# Patient Record
Sex: Female | Born: 1980 | Race: White | Hispanic: No | Marital: Single | Smoking: Current every day smoker
Health system: Southern US, Community
[De-identification: ages and names within clinical notes are randomized; demographics above are authoritative.]

## PROBLEM LIST (undated history)

## (undated) DIAGNOSIS — N200 Calculus of kidney: Secondary | ICD-10-CM

## (undated) HISTORY — PX: ABDOMINAL SURGERY: SHX537

## (undated) HISTORY — PX: TONSILLECTOMY: SUR1361

---

## 2015-06-08 ENCOUNTER — Emergency Department (HOSPITAL_COMMUNITY): Payer: Self-pay

## 2015-06-08 ENCOUNTER — Emergency Department (HOSPITAL_COMMUNITY)
Admission: EM | Admit: 2015-06-08 | Discharge: 2015-06-08 | Disposition: A | Discharge status: Home / Self Care | Payer: Self-pay | Attending: Emergency Medicine | Admitting: Emergency Medicine

## 2015-06-08 ENCOUNTER — Encounter (HOSPITAL_COMMUNITY): Payer: Self-pay | Admitting: Emergency Medicine

## 2015-06-08 DIAGNOSIS — S90519A Abrasion, unspecified ankle, initial encounter: Secondary | ICD-10-CM

## 2015-06-08 DIAGNOSIS — S90512A Abrasion, left ankle, initial encounter: Secondary | ICD-10-CM | POA: Insufficient documentation

## 2015-06-08 DIAGNOSIS — Y998 Other external cause status: Secondary | ICD-10-CM | POA: Insufficient documentation

## 2015-06-08 DIAGNOSIS — Y9389 Activity, other specified: Secondary | ICD-10-CM | POA: Insufficient documentation

## 2015-06-08 DIAGNOSIS — Y9289 Other specified places as the place of occurrence of the external cause: Secondary | ICD-10-CM | POA: Insufficient documentation

## 2015-06-08 DIAGNOSIS — S60811A Abrasion of right wrist, initial encounter: Secondary | ICD-10-CM | POA: Insufficient documentation

## 2015-06-08 DIAGNOSIS — S60819A Abrasion of unspecified wrist, initial encounter: Secondary | ICD-10-CM

## 2015-06-08 DIAGNOSIS — S90511A Abrasion, right ankle, initial encounter: Secondary | ICD-10-CM | POA: Insufficient documentation

## 2015-06-08 DIAGNOSIS — F17200 Nicotine dependence, unspecified, uncomplicated: Secondary | ICD-10-CM | POA: Insufficient documentation

## 2015-06-08 DIAGNOSIS — S60812A Abrasion of left wrist, initial encounter: Secondary | ICD-10-CM | POA: Insufficient documentation

## 2015-06-08 NOTE — ED Notes (Signed)
Patient c/o bilateral ankle and wrist pain. Patient is in custody of police. Patient was involved in an altercation with the police, was attempting to free herself from their handcuffs. When asked what happened patient states "its obvious".

## 2015-06-08 NOTE — ED Notes (Signed)
MD at bedside. 

## 2015-06-08 NOTE — ED Provider Notes (Signed)
CSN: [id number]     Arrival date & time 06/08/15  0542 History   First MD Initiated Contact with Patient 06/08/15 [phone]     Chief Complaint  Patient presents with  . Ankle Pain    bilateral  . Wrist Pain    bilateral     (Consider location/radiation/quality/duration/timing/severity/associated sxs/prior Treatment) HPI  This is a 34 year old female who was brought in in police custody with wrist and ankle pain. Patient is minimally contributory for history taking.  She states "they did this to me." She reports pain in her bilateral wrists and ankles. She has been handcuffed. Per police, patient was being arrested and became combative. She required maximal restraints. She was handcuffed and shackled. She pulled on the handcuffs and shackles. They were unable to take her to jail because the nurse at jail was concerned given the abrasions to her wrist and ankles.  History reviewed. No pertinent past medical history. History reviewed. No pertinent past surgical history. History reviewed. No pertinent family history. Social History  Substance Use Topics  . Smoking status: Current Every Day Smoker  . Smokeless tobacco: None  . Alcohol Use: No   OB History    No data available     Review of Systems  Musculoskeletal:       Wrist and ankle pain  Skin: Positive for wound.  All other systems reviewed and are negative.     Allergies  Sulfa antibiotics  Home Medications   Prior to Admission medications   Not on File   BP 122/78 mmHg  Pulse 55  Temp(Src) 98.7 F (37.1 C) (Oral)  Resp 18  SpO2 98%  LMP 06/07/2015 (Exact Date) Physical Exam  Constitutional: She is oriented to person, place, and time. No distress.  Disheveled, no acute distress, ABC's intact  HENT:  Head: Normocephalic and atraumatic.  Cardiovascular: Normal rate, regular rhythm and normal heart sounds.   No murmur heard. Pulmonary/Chest: Effort normal and breath sounds normal. No respiratory distress. She  has no wheezes.  Musculoskeletal:  Superficial abrasions and erythema to the bilateral wrists corresponding to handcuffs marks, she also has superficial abrasions over her knuckles, no obvious deformities, full range of motion, full strength, she does have tenderness to palpation over the left snuffbox Superficial abrasions noted over the bilateral ankles again consistent with cuff marks, normal range of motion, 2+ DP pulse, patient has been ambulatory.  Neurological: She is alert and oriented to person, place, and time.  Skin: Skin is warm and dry.  Psychiatric: She has a normal mood and affect.  Nursing note and vitals reviewed.   ED Course  Procedures (including critical care time) Labs Review Labs Reviewed - No data to display  Imaging Review No results found. I have personally reviewed and evaluated these images and lab results as part of my medical decision-making.   EKG Interpretation None      MDM   Final diagnoses:  Ankle abrasion, unspecified laterality, initial encounter  Abrasion of wrist, unspecified laterality, initial encounter    Patient presents with bilateral wrist and ankle pain. She has been in police custody and handcuffed and in shackles. Per police report, patient has been combative and tried to get out of shackles. She has superficial abrasions and excoriations. She does have about box tenderness on the left. Plain films obtained and negative. She was placed in a on the left and instructed to follow-up in one week to evaluate for occult scaphoid fracture. Patient was released into police custody.  After history, exam, and medical workup I feel the patient has been appropriately medically screened and is safe for discharge home. Pertinent diagnoses were discussed with the patient. Patient was given return precautions.   Shon Batonourtney F Saqib Cazarez, MD 06/10/15 [phone]

## 2015-06-08 NOTE — Discharge Instructions (Signed)
You were seen today for wrist and ankle pain. You have abrasion secondary to handcuffs. Even the tenderness in your left wrist, you will be placed in a wrist splint. He may have a fracture that does not show up on x-ray. You need to have repeat x-rays in one week.  Abrasion An abrasion is a cut or scrape on the outer surface of your skin. An abrasion does not extend through all of the layers of your skin. It is important to care for your abrasion properly to prevent infection. CAUSES Most abrasions are caused by falling on or gliding across the ground or another surface. When your skin rubs on something, the outer and inner layer of skin rubs off.  SYMPTOMS A cut or scrape is the main symptom of this condition. The scrape may be bleeding, or it may appear red or pink. If there was an associated fall, there may be an underlying bruise. DIAGNOSIS An abrasion is diagnosed with a physical exam. TREATMENT Treatment for this condition depends on how large and deep the abrasion is. Usually, your abrasion will be cleaned with water and mild soap. This removes any dirt or debris that may be stuck. An antibiotic ointment may be applied to the abrasion to help prevent infection. A bandage (dressing) may be placed on the abrasion to keep it clean. You may also need a tetanus shot. HOME CARE INSTRUCTIONS Medicines  Take or apply medicines only as directed by your health care provider.  If you were prescribed an antibiotic ointment, finish all of it even if you start to feel better. Wound Care  Clean the wound with mild soap and water 2-3 times per day or as directed by your health care provider. Pat your wound dry with a clean towel. Do not rub it.  There are many different ways to close and cover a wound. Follow instructions from your health care provider about:  Wound care.  Dressing changes and removal.  Check your wound every day for signs of infection. Watch for:  Redness, swelling, or  pain.  Fluid, blood, or pus. General Instructions  Keep the dressing dry as directed by your health care provider. Do not take baths, swim, use a hot tub, or do anything that would put your wound underwater until your health care provider approves.  If there is swelling, raise (elevate) the injured area above the level of your heart while you are sitting or lying down.  Keep all follow-up visits as directed by your health care provider. This is important. SEEK MEDICAL CARE IF:  You received a tetanus shot and you have swelling, severe pain, redness, or bleeding at the injection site.  Your pain is not controlled with medicine.  You have increased redness, swelling, or pain at the site of your wound. SEEK IMMEDIATE MEDICAL CARE IF:  You have a red streak going away from your wound.  You have a fever.  You have fluid, blood, or pus coming from your wound.  You notice a bad smell coming from your wound or your dressing.   This information is not intended to replace advice given to you by your health care provider. Make sure you discuss any questions you have with your health care provider.   Document Released: 04/02/2005 Document Revised: 03/14/2015 Document Reviewed: 06/21/2014 Elsevier Interactive Patient Education [employer]2016 [employer]

## 2015-06-08 NOTE — ED Notes (Signed)
Patient ambulatory into triage, in handcuffs. Police accompanying patient, sitting at bedside at this time. Patient with full ROM x4. Patient with redness and abrasions x4. Patient c/o pain to right wrist when hand is touched.

## 2016-05-14 ENCOUNTER — Emergency Department (HOSPITAL_COMMUNITY): Payer: Self-pay

## 2016-05-14 ENCOUNTER — Encounter (HOSPITAL_COMMUNITY): Payer: Self-pay | Admitting: Emergency Medicine

## 2016-05-14 ENCOUNTER — Emergency Department (HOSPITAL_COMMUNITY)
Admission: EM | Admit: 2016-05-14 | Discharge: 2016-05-15 | Disposition: A | Discharge status: Home / Self Care | Payer: Self-pay | Attending: Emergency Medicine | Admitting: Emergency Medicine

## 2016-05-14 DIAGNOSIS — F17210 Nicotine dependence, cigarettes, uncomplicated: Secondary | ICD-10-CM | POA: Insufficient documentation

## 2016-05-14 DIAGNOSIS — Y999 Unspecified external cause status: Secondary | ICD-10-CM | POA: Insufficient documentation

## 2016-05-14 DIAGNOSIS — R52 Pain, unspecified: Secondary | ICD-10-CM

## 2016-05-14 DIAGNOSIS — Y929 Unspecified place or not applicable: Secondary | ICD-10-CM | POA: Insufficient documentation

## 2016-05-14 DIAGNOSIS — Y9389 Activity, other specified: Secondary | ICD-10-CM | POA: Insufficient documentation

## 2016-05-14 DIAGNOSIS — W010XXA Fall on same level from slipping, tripping and stumbling without subsequent striking against object, initial encounter: Secondary | ICD-10-CM | POA: Insufficient documentation

## 2016-05-14 DIAGNOSIS — S53105A Unspecified dislocation of left ulnohumeral joint, initial encounter: Secondary | ICD-10-CM

## 2016-05-14 DIAGNOSIS — S53025A Posterior dislocation of left radial head, initial encounter: Secondary | ICD-10-CM | POA: Insufficient documentation

## 2016-05-14 HISTORY — DX: Calculus of kidney: N20.0

## 2016-05-14 MED ORDER — HYDROMORPHONE HCL 1 MG/ML IJ SOLN
1.0000 mg | Freq: Once | INTRAMUSCULAR | Status: AC
  Administered 2016-05-14: 1 mg via INTRAVENOUS
  Filled 2016-05-14: qty 1

## 2016-05-14 NOTE — ED Notes (Signed)
Bed: [id number] Expected date:  Expected time:  Means of arrival:  Comments: EMS elbow injury

## 2016-05-14 NOTE — ED Provider Notes (Signed)
[facility]-EMERGENCY DEPT Provider Note   CSN: [id number] Arrival date & time: 05/14/16  2235   By signing my name below, I, Emmanuella Mensah, attest that this documentation has been prepared under the direction and in the presence of Zadie Rhine, MD. Electronically Signed: Angelene Giovanni, ED Scribe. 05/14/16. 11:30 PM.   History   Chief Complaint Chief Complaint  Patient presents with  . Elbow Injury    HPI Comments: Sara Vazquez is a 35 y.o. female brought in by ambulance, who presents to the Emergency Department complaining of gradually worsening moderate left elbow pain s/p fall that occurred PTA. She notes that the pain is worse with movement. She reports associated mild left shoulder pain and an abrasion on her left knee. She adds that the abrasion was from a prior incident but the fall caused it to bleed. She explains that she was jogging on slippery rocks when she slipped and left onto her left side unto a rock. She denies any head injuries or LOC. No other injuries noted. Pt did not try any medications prior to EMS arrival. She received 100 mcg fentanyl by EMS en route. She reports an allergy to sulfa antibiotics. Her last meal was at 6:30 pm tonight. She denies any fever, headaches, numbness, tingling in LUE, weakness, nausea, vomiting, abdominal pain, chest pain, or any other symptoms.   The history is provided by the patient. No language interpreter was used.  Hand Injury   The incident occurred 1 to 2 hours ago. The incident occurred in the street. The injury mechanism was a fall. The pain is present in the left elbow and left shoulder. The pain is moderate. The pain has been constant since the incident. Pertinent negatives include no fever. She reports no foreign bodies present. The symptoms are aggravated by movement. Treatments tried: fentanyl en route. The treatment provided mild relief.    Past Medical History:  Diagnosis Date  . Kidney stones     There are no active  problems to display for this patient.   No past surgical history on file.  OB History    Gravida Para Term Preterm AB Living   5 5 4 1   4    SAB TAB Ectopic Multiple Live Births                   Home Medications    Prior to Admission medications   Not on File    Family History History reviewed. No pertinent family history.  Social History Social History  Substance Use Topics  . Smoking status: Current Every Day Smoker    Types: Cigarettes  . Smokeless tobacco: Never Used  . Alcohol use Yes     Allergies   Shellfish allergy and Sulfa antibiotics   Review of Systems Review of Systems  Constitutional: Negative for fever.  Cardiovascular: Negative for chest pain.  Gastrointestinal: Negative for abdominal pain, nausea and vomiting.  Musculoskeletal: Positive for arthralgias.  Skin: Positive for wound (abrasion).  Neurological: Negative for weakness, numbness and headaches.  All other systems reviewed and are negative.    Physical Exam Updated Vital Signs BP 138/82 (BP Location: Right Arm)   Pulse (!) 122   Temp 98.6 F (37 C) (Oral)   Resp 24   Ht 5\' 7"  (1.702 m)   Wt 125 lb (56.7 kg)   LMP 04/26/2016   SpO2 100%   BMI 19.58 kg/m   Physical Exam  Nursing note and vitals reviewed.  CONSTITUTIONAL: Well developed/well nourished  HEAD: Normocephalic/atraumatic EYES: EOMI/PERRL ENMT: Mucous membranes moist NECK: supple no meningeal signs SPINE/BACK:entire spine nontender CV: S1/S2 noted, no murmurs/rubs/gallops noted LUNGS: Lungs are clear to auscultation bilaterally, no apparent distress ABDOMEN: soft, nontender, no rebound or guarding, bowel sounds noted throughout abdomen NEURO: Pt is awake/alert/appropriate, moves all extremitiesx4.  No facial droop.   EXTREMITIES: pulses normal/equal, full ROM; tenderness and deformity to left elbow; no lacerations noted; distal pulses intact; distal motor intact to left hand, All other extremities/joints  palpated/ranged and nontender SKIN: warm, color normal PSYCH: no abnormalities of mood noted, alert and oriented to situation   ED Treatments / Results  DIAGNOSTIC STUDIES: Oxygen Saturation is 100% on RA, normal by my interpretation.    COORDINATION OF CARE: 11:17 PM- Pt advised of plan for treatment and pt agrees. Pt will receive left elbow x-ray for further evaluation. Consent form given for sedation for possible reduction of left elbow.    Labs (all labs ordered are listed, but only abnormal results are displayed) Labs Reviewed - No data to display  EKG  EKG Interpretation None       Radiology No results found.  Procedures Reduction of dislocation Date/Time: 05/15/2016 12:50 AM Performed by: Zadie RhineWICKLINE, Stylianos Stradling Authorized by: Zadie RhineWICKLINE, Maize Brittingham  Consent: Written consent obtained. Consent given by: patient Patient understanding: patient states understanding of the procedure being performed Patient consent: the patient's understanding of the procedure matches consent given Procedure consent: procedure consent matches procedure scheduled Relevant documents: relevant documents present and verified Test results: test results available and properly labeled Imaging studies: imaging studies available Patient identity confirmed: verbally with patient, arm band and provided demographic data Time out: Immediately prior to procedure a "time out" was called to verify the correct patient, procedure, equipment, support staff and site/side marked as required.  Sedation: Patient sedated: yes (see separate note) Vitals: Vital signs were monitored during sedation. Patient tolerance: Patient tolerated the procedure well with no immediate complications Comments: Performed closed reduction of left elbow posterior dislocation Patient tolerated procedure without any immediate complications     Procedural sedation Performed by: Joya GaskinsWICKLINE,Peder Allums W Consent: Verbal consent obtained. Risks and  benefits: risks, benefits and alternatives were discussed Required items: required  devices, and special equipment available Patient identity confirmed: arm band and provided demographic data Time out: Immediately prior to procedure a "time out" was called to verify the correct patient, procedure, equipment, support staff and site/side marked as required. Sedation type: moderate (conscious) sedation NPO time confirmed and considered Sedatives: PROPOFOL Physician Time at Bedside: 16 Vitals: Vital signs were monitored during sedation. Cardiac Monitor, pulse oximeter Patient tolerance: Patient tolerated the procedure well with no immediate complications. Comments: Pt with uneventful recovery. Returned to pre-procedural sedation baseline   SPLINT APPLICATION Date/Time: 1:09 AM Authorized by: Joya GaskinsWICKLINE,Ayeden Gladman W Consent: Verbal consent obtained. Risks and benefits: risks, benefits and alternatives were discussed Consent given by: patient Splint applied by: orthopedic technician Location details: left elbow Splint type: posterior splint Supplies used: ortho glass Post-procedure: The splinted body part was neurovascularly unchanged following the procedure. Patient tolerance: Patient tolerated the procedure well with no immediate complications.     Medications Ordered in ED Medications  HYDROmorphone (DILAUDID) injection 1 mg (1 mg Intravenous Given 05/14/16 2334)  propofol (DIPRIVAN) 10 mg/mL bolus/IV push 28.4 mg (40 mg Intravenous Given 05/15/16 0049)     Initial Impression / Assessment and Plan / ED Course  Zadie Rhineonald Wayman Hoard, MD has reviewed the triage vital signs and the nursing notes.  Pertinent imaging results that  were available during my care of the patient were reviewed by me and considered in my medical decision making (see chart for details).  Clinical Course     Pt with isolated left elbow dislocation Pt tolerated reduction well After reduction, deformity resolved and she  was able to flex/extend elbow This was successfully reduced Referred to ortho She is in posterior splint and also sling (advised to range shoulder frequently while in sling  Final Clinical Impressions(s) / ED Diagnoses   Final diagnoses:  Pain  Dislocation of left elbow, initial encounter    New Prescriptions New Prescriptions   HYDROCODONE-ACETAMINOPHEN (NORCO/VICODIN) 5-325 MG TABLET    Take 1 tablet by mouth every 6 (six) hours as needed for severe pain.   IBUPROFEN (ADVIL,MOTRIN) 600 MG TABLET    Take 1 tablet (600 mg total) by mouth every 8 (eight) hours as needed.   I personally performed the services described in this documentation, which was scribed in my presence. The recorded information has been reviewed and is accurate.        Zadie Rhineonald Cung Masterson, MD 05/15/16 [phone]

## 2016-05-14 NOTE — ED Triage Notes (Addendum)
Pt was running across a courtyard and tripped, causing her to fall on her left elbow; ETOH on board; 100 mcg fentanyl given en route

## 2016-05-15 ENCOUNTER — Emergency Department (HOSPITAL_COMMUNITY): Payer: Self-pay

## 2016-05-15 MED ORDER — HYDROCODONE-ACETAMINOPHEN 5-325 MG PO TABS: 1.0000 | ORAL_TABLET | Freq: Four times a day (QID) | ORAL | 0 refills | Status: DC | PRN

## 2016-05-15 MED ORDER — IBUPROFEN 800 MG PO TABS
800.0000 mg | ORAL_TABLET | Freq: Once | ORAL | Status: AC
  Administered 2016-05-15: 800 mg via ORAL
  Filled 2016-05-15: qty 1

## 2016-05-15 MED ORDER — SODIUM CHLORIDE 0.9 % IV SOLN
Freq: Once | INTRAVENOUS | Status: AC
  Administered 2016-05-14: 23:00:00 via INTRAVENOUS

## 2016-05-15 MED ORDER — IBUPROFEN 600 MG PO TABS: 600.0000 mg | ORAL_TABLET | Freq: Three times a day (TID) | ORAL | 0 refills | Status: DC | PRN

## 2016-05-15 MED ORDER — PROPOFOL 10 MG/ML IV BOLUS
0.5000 mg/kg | Freq: Once | INTRAVENOUS | Status: AC
  Administered 2016-05-15: 40 mg via INTRAVENOUS
  Filled 2016-05-15: qty 20

## 2016-05-15 NOTE — Progress Notes (Signed)
Orthopedic Tech Progress Note Patient Details:  Sara PaganiniJoy Vazquez Nov 05, 1980 [id number]  Ortho Devices Type of Ortho Device: Long arm splint, Arm sling Ortho Device/Splint Interventions: Application   Alvina ChouWilliams, Derik Fults C 05/15/2016, 1:15 AM

## 2016-05-19 ENCOUNTER — Ambulatory Visit (INDEPENDENT_AMBULATORY_CARE_PROVIDER_SITE_OTHER): Payer: Self-pay | Admitting: Orthopaedic Surgery

## 2016-10-02 ENCOUNTER — Inpatient Hospital Stay (INDEPENDENT_AMBULATORY_CARE_PROVIDER_SITE_OTHER): Payer: Self-pay | Admitting: Physician Assistant

## 2017-06-12 ENCOUNTER — Encounter (HOSPITAL_COMMUNITY): Payer: Self-pay | Admitting: Emergency Medicine

## 2017-06-12 ENCOUNTER — Emergency Department (HOSPITAL_COMMUNITY): Payer: Self-pay

## 2017-06-12 ENCOUNTER — Emergency Department (HOSPITAL_COMMUNITY)
Admission: EM | Admit: 2017-06-12 | Discharge: 2017-06-13 | Disposition: A | Discharge status: Other Acute Inpatient Hospital | Payer: Federal, State, Local not specified - Other | Attending: Emergency Medicine | Admitting: Emergency Medicine

## 2017-06-12 DIAGNOSIS — F17210 Nicotine dependence, cigarettes, uncomplicated: Secondary | ICD-10-CM | POA: Insufficient documentation

## 2017-06-12 DIAGNOSIS — F1994 Other psychoactive substance use, unspecified with psychoactive substance-induced mood disorder: Secondary | ICD-10-CM

## 2017-06-12 DIAGNOSIS — Z79899 Other long term (current) drug therapy: Secondary | ICD-10-CM | POA: Insufficient documentation

## 2017-06-12 DIAGNOSIS — R45851 Suicidal ideations: Secondary | ICD-10-CM | POA: Insufficient documentation

## 2017-06-12 DIAGNOSIS — F1910 Other psychoactive substance abuse, uncomplicated: Secondary | ICD-10-CM | POA: Diagnosis present

## 2017-06-12 DIAGNOSIS — F1914 Other psychoactive substance abuse with psychoactive substance-induced mood disorder: Secondary | ICD-10-CM | POA: Insufficient documentation

## 2017-06-12 LAB — COMPREHENSIVE METABOLIC PANEL
ALK PHOS: 54 U/L (ref 38–126)
ALT: 22 U/L (ref 14–54)
ANION GAP: 9 (ref 5–15)
AST: 29 U/L (ref 15–41)
Albumin: 4.4 g/dL (ref 3.5–5.0)
BUN: 16 mg/dL (ref 6–20)
CALCIUM: 9 mg/dL (ref 8.9–10.3)
CO2: 23 mmol/L (ref 22–32)
Chloride: 106 mmol/L (ref 101–111)
Creatinine, Ser: 1 mg/dL (ref 0.44–1.00)
Glucose, Bld: 106 mg/dL — ABNORMAL HIGH (ref 65–99)
Potassium: 3.3 mmol/L — ABNORMAL LOW (ref 3.5–5.1)
SODIUM: 138 mmol/L (ref 135–145)
Total Bilirubin: 1.7 mg/dL — ABNORMAL HIGH (ref 0.3–1.2)
Total Protein: 7.2 g/dL (ref 6.5–8.1)

## 2017-06-12 LAB — CBC WITH DIFFERENTIAL/PLATELET
Basophils Absolute: 0 10*3/uL (ref 0.0–0.1)
Basophils Relative: 0 %
Eosinophils Absolute: 0 10*3/uL (ref 0.0–0.7)
Eosinophils Relative: 0 %
HEMATOCRIT: 34.3 % — AB (ref 36.0–46.0)
Hemoglobin: 11.3 g/dL — ABNORMAL LOW (ref 12.0–15.0)
LYMPHS ABS: 1.4 10*3/uL (ref 0.7–4.0)
Lymphocytes Relative: 8 %
MCH: 29.5 pg (ref 26.0–34.0)
MCHC: 32.9 g/dL (ref 30.0–36.0)
MCV: 89.6 fL (ref 78.0–100.0)
MONO ABS: 0.9 10*3/uL (ref 0.1–1.0)
MONOS PCT: 6 %
NEUTROS ABS: 14.6 10*3/uL — AB (ref 1.7–7.7)
NEUTROS PCT: 86 %
Platelets: 273 10*3/uL (ref 150–400)
RBC: 3.83 MIL/uL — ABNORMAL LOW (ref 3.87–5.11)
RDW: 13.6 % (ref 11.5–15.5)
WBC: 16.9 10*3/uL — ABNORMAL HIGH (ref 4.0–10.5)

## 2017-06-12 LAB — RAPID URINE DRUG SCREEN, HOSP PERFORMED
Amphetamines: POSITIVE — AB
Barbiturates: NOT DETECTED
Benzodiazepines: POSITIVE — AB
COCAINE: POSITIVE — AB
OPIATES: NOT DETECTED
Tetrahydrocannabinol: POSITIVE — AB

## 2017-06-12 LAB — PREGNANCY, URINE: Preg Test, Ur: NEGATIVE

## 2017-06-12 LAB — CK
Total CK: 442 U/L — ABNORMAL HIGH (ref 38–234)
Total CK: 276 U/L — ABNORMAL HIGH (ref 38–234)

## 2017-06-12 LAB — LITHIUM LEVEL: Lithium Lvl: <0.06 mmol/L — ABNORMAL LOW (ref 0.60–1.20)

## 2017-06-12 LAB — ETHANOL

## 2017-06-12 MED ORDER — LORAZEPAM 2 MG/ML IJ SOLN
1.0000 mg | Freq: Once | INTRAMUSCULAR | Status: AC
  Administered 2017-06-12: 1 mg via INTRAMUSCULAR
  Filled 2017-06-12: qty 1

## 2017-06-12 MED ORDER — HALOPERIDOL 5 MG PO TABS
5.0000 mg | ORAL_TABLET | Freq: Once | ORAL | Status: DC
Start: 2017-06-12 — End: 2017-06-12

## 2017-06-12 MED ORDER — SODIUM CHLORIDE 0.9 % IV BOLUS (SEPSIS)
1000.0000 mL | Freq: Once | INTRAVENOUS | Status: AC
  Administered 2017-06-12: 1000 mL via INTRAVENOUS

## 2017-06-12 MED ORDER — ONDANSETRON HCL 4 MG PO TABS: 4.0000 mg | ORAL_TABLET | Freq: Three times a day (TID) | ORAL | Status: DC | PRN

## 2017-06-12 MED ORDER — ACETAMINOPHEN 325 MG PO TABS: 650.0000 mg | ORAL_TABLET | ORAL | Status: DC | PRN

## 2017-06-12 MED ORDER — ZIPRASIDONE MESYLATE 20 MG IM SOLR
10.0000 mg | Freq: Once | INTRAMUSCULAR | Status: AC
Start: 2017-06-12 — End: 2017-06-12
  Administered 2017-06-12: 10 mg via INTRAMUSCULAR
  Filled 2017-06-12: qty 20

## 2017-06-12 MED ORDER — NICOTINE 21 MG/24HR TD PT24
21.0000 mg | MEDICATED_PATCH | Freq: Every day | TRANSDERMAL | Status: DC
  Administered 2017-06-13: 21 mg via TRANSDERMAL
  Filled 2017-06-12: qty 1

## 2017-06-12 NOTE — ED Notes (Signed)
Bed: WA29 Expected date:  Expected time:  Means of arrival:  Comments: Resus B

## 2017-06-12 NOTE — ED Notes (Signed)
Patient ripping off EKG leads and trying to kick ED staff and spit on ED staff while attempting to hook patient up to heart monitor.
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
## 2017-06-12 NOTE — ED Notes (Signed)
PA at bedside.

## 2017-06-12 NOTE — BH Assessment (Signed)
[facility] Assessment Progress Note This writer attempted to assess patient at 1700 hours unsuccessfully. Patient is sedated/impaired TTS assessment unable to be completed.

## 2017-06-12 NOTE — BHH Counselor (Signed)
@  1936, Per Hardie LoraLilibeth, RN pt is still sleeping. Clincian will continue to check in with RN and pt.    Redmond Pullingreylese D Tyshana Nishida, MS, [facility], [facility] Triage Specialist [phone]

## 2017-06-13 ENCOUNTER — Encounter (HOSPITAL_COMMUNITY): Payer: Self-pay | Admitting: Emergency Medicine

## 2017-06-13 ENCOUNTER — Inpatient Hospital Stay (HOSPITAL_COMMUNITY)
Admission: AD | Admit: 2017-06-13 | Discharge: 2017-06-18 | DRG: 885 | Disposition: A | Discharge status: Home / Self Care | Payer: Federal, State, Local not specified - Other | Attending: Psychiatry | Admitting: Psychiatry

## 2017-06-13 ENCOUNTER — Encounter (HOSPITAL_COMMUNITY): Payer: Self-pay | Admitting: *Deleted

## 2017-06-13 ENCOUNTER — Other Ambulatory Visit: Payer: Self-pay

## 2017-06-13 DIAGNOSIS — Z881 Allergy status to other antibiotic agents status: Secondary | ICD-10-CM

## 2017-06-13 DIAGNOSIS — F1220 Cannabis dependence, uncomplicated: Secondary | ICD-10-CM | POA: Diagnosis present

## 2017-06-13 DIAGNOSIS — R45850 Homicidal ideations: Secondary | ICD-10-CM

## 2017-06-13 DIAGNOSIS — F3113 Bipolar disorder, current episode manic without psychotic features, severe: Principal | ICD-10-CM | POA: Diagnosis present

## 2017-06-13 DIAGNOSIS — F1420 Cocaine dependence, uncomplicated: Secondary | ICD-10-CM | POA: Diagnosis present

## 2017-06-13 DIAGNOSIS — F17210 Nicotine dependence, cigarettes, uncomplicated: Secondary | ICD-10-CM

## 2017-06-13 DIAGNOSIS — Z79899 Other long term (current) drug therapy: Secondary | ICD-10-CM

## 2017-06-13 DIAGNOSIS — R4587 Impulsiveness: Secondary | ICD-10-CM

## 2017-06-13 DIAGNOSIS — Z91013 Allergy to seafood: Secondary | ICD-10-CM

## 2017-06-13 DIAGNOSIS — F419 Anxiety disorder, unspecified: Secondary | ICD-10-CM | POA: Diagnosis present

## 2017-06-13 DIAGNOSIS — Z590 Homelessness: Secondary | ICD-10-CM

## 2017-06-13 DIAGNOSIS — F1410 Cocaine abuse, uncomplicated: Secondary | ICD-10-CM | POA: Diagnosis not present

## 2017-06-13 DIAGNOSIS — F4310 Post-traumatic stress disorder, unspecified: Secondary | ICD-10-CM | POA: Diagnosis present

## 2017-06-13 DIAGNOSIS — R45851 Suicidal ideations: Secondary | ICD-10-CM

## 2017-06-13 DIAGNOSIS — F1914 Other psychoactive substance abuse with psychoactive substance-induced mood disorder: Secondary | ICD-10-CM | POA: Diagnosis present

## 2017-06-13 DIAGNOSIS — F332 Major depressive disorder, recurrent severe without psychotic features: Secondary | ICD-10-CM | POA: Diagnosis not present

## 2017-06-13 DIAGNOSIS — F1490 Cocaine use, unspecified, uncomplicated: Secondary | ICD-10-CM | POA: Diagnosis not present

## 2017-06-13 DIAGNOSIS — F319 Bipolar disorder, unspecified: Secondary | ICD-10-CM | POA: Diagnosis present

## 2017-06-13 DIAGNOSIS — Z9119 Patient's noncompliance with other medical treatment and regimen: Secondary | ICD-10-CM | POA: Diagnosis not present

## 2017-06-13 DIAGNOSIS — F3170 Bipolar disorder, currently in remission, most recent episode unspecified: Secondary | ICD-10-CM | POA: Diagnosis not present

## 2017-06-13 DIAGNOSIS — F1994 Other psychoactive substance use, unspecified with psychoactive substance-induced mood disorder: Secondary | ICD-10-CM | POA: Diagnosis not present

## 2017-06-13 DIAGNOSIS — F1910 Other psychoactive substance abuse, uncomplicated: Secondary | ICD-10-CM | POA: Diagnosis present

## 2017-06-13 DIAGNOSIS — Z716 Tobacco abuse counseling: Secondary | ICD-10-CM

## 2017-06-13 DIAGNOSIS — F1290 Cannabis use, unspecified, uncomplicated: Secondary | ICD-10-CM | POA: Diagnosis not present

## 2017-06-13 DIAGNOSIS — F1210 Cannabis abuse, uncomplicated: Secondary | ICD-10-CM | POA: Diagnosis not present

## 2017-06-13 DIAGNOSIS — G4700 Insomnia, unspecified: Secondary | ICD-10-CM | POA: Diagnosis present

## 2017-06-13 MED ORDER — HYDROXYZINE HCL 25 MG PO TABS
25.0000 mg | ORAL_TABLET | Freq: Three times a day (TID) | ORAL | Status: DC | PRN
  Filled 2017-06-13: qty 10

## 2017-06-13 MED ORDER — TRAZODONE HCL 100 MG PO TABS
100.0000 mg | ORAL_TABLET | Freq: Every day | ORAL | Status: DC
Start: 2017-06-13 — End: 2017-06-13

## 2017-06-13 MED ORDER — HYDROXYZINE HCL 25 MG PO TABS: 25.0000 mg | ORAL_TABLET | Freq: Three times a day (TID) | ORAL | Status: DC | PRN

## 2017-06-13 MED ORDER — ALUM & MAG HYDROXIDE-SIMETH 200-200-20 MG/5ML PO SUSP: 30.0000 mL | ORAL | Status: DC | PRN

## 2017-06-13 MED ORDER — ACETAMINOPHEN 325 MG PO TABS
650.0000 mg | ORAL_TABLET | ORAL | Status: DC | PRN
  Administered 2017-06-15 – 2017-06-16 (×2): 650 mg via ORAL
  Filled 2017-06-13 (×2): qty 2

## 2017-06-13 MED ORDER — MAGNESIUM HYDROXIDE 400 MG/5ML PO SUSP
30.0000 mL | Freq: Every day | ORAL | Status: DC | PRN
  Administered 2017-06-17: 30 mL via ORAL
  Filled 2017-06-13: qty 30

## 2017-06-13 MED ORDER — ONDANSETRON HCL 4 MG PO TABS: 4.0000 mg | ORAL_TABLET | Freq: Three times a day (TID) | ORAL | Status: DC | PRN

## 2017-06-13 MED ORDER — ACETAMINOPHEN 325 MG PO TABS: 650.0000 mg | ORAL_TABLET | Freq: Four times a day (QID) | ORAL | Status: DC | PRN

## 2017-06-13 MED ORDER — TRAZODONE HCL 100 MG PO TABS
100.0000 mg | ORAL_TABLET | Freq: Every day | ORAL | Status: DC
  Administered 2017-06-14 – 2017-06-17 (×4): 100 mg via ORAL
  Filled 2017-06-13 (×2): qty 1
  Filled 2017-06-13: qty 7
  Filled 2017-06-13: qty 1
  Filled 2017-06-13: qty 7
  Filled 2017-06-13 (×3): qty 1

## 2017-06-13 MED ORDER — NICOTINE 21 MG/24HR TD PT24
21.0000 mg | MEDICATED_PATCH | Freq: Every day | TRANSDERMAL | Status: DC
  Administered 2017-06-15: 21 mg via TRANSDERMAL
  Filled 2017-06-13 (×6): qty 1
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
## 2017-06-13 NOTE — ED Notes (Signed)
Pt talking on hallway phone.  

## 2017-06-13 NOTE — BH Assessment (Signed)
[facility] Assessment Progress Note    [facility] and can be admitted at 4 pm.

## 2017-06-13 NOTE — ED Notes (Signed)
This nurse attempted to call [facility], informed nurse will call back.,

## 2017-06-13 NOTE — B[facility] Counselor (Signed)
Pt has been faxed to the following inpatient treatment facilities:       [facility].  [facility].  [facility].  [facility].  [facility].  [facility].  [facility].  TTS will continued to follow up and seek placement.  Redmond Pullingreylese D Jameis Newsham, MS, [facility], [facility] Triage Specialist [phone]

## 2017-06-13 NOTE — ED Provider Notes (Signed)
Care assumed from previous provider PA McDonald. Please see their note for further details to include full history and physical. To summarize in short pt is a 36 year old female presents the ED with homicidal ideations and patient.  Patient noted to have a mildly elevated CK when lab work was drawn.  This was treated with fluids.  Repeat CK has improved.  Prior provider felt like with improving CK they can be cleared for TTS evaluation and disposition.. Case discussed, plan agreed upon.  TTS did evaluate patient and patient meets inpatient criteria.  Psych: Orders were placed.  Patient remained hemodynamic stable.       Rise MuLeaphart, Kenneth T, PA-C 06/13/17 0220    Doug SouJacubowitz, Sam, MD 06/13/17 [phone]

## 2017-06-13 NOTE — ED Notes (Signed)
Pt reports "mental breakdown" is what brought her to the hospital. Pt denies SI/HI/AVH, Pt behavior labile. Encouragement and support provided. Special checks q 15 mins in place for safety, Video monitoring in place. Will continue to monitor.

## 2017-06-13 NOTE — Tx Team (Signed)
Initial Treatment Plan 06/13/2017 8:20 PM Sara Vazquez ZOX:[id number]    PATIENT STRESSORS: Marital or family conflict Medication change or noncompliance Substance abuse   PATIENT STRENGTHS: Ability for insight Average or above average intelligence Capable of independent living General fund of knowledge Motivation for treatment/growth   PATIENT IDENTIFIED PROBLEMS: Anxiety Substance Abuse "I have multiple personalities" "Getting all of my medications that are required" "Communicating with other people is a problem with me"                     DISCHARGE CRITERIA:  Ability to meet basic life and health needs Improved stabilization in mood, thinking, and/or behavior Verbal commitment to aftercare and medication compliance Withdrawal symptoms are absent or subacute and managed without 24-hour nursing intervention  PRELIMINARY DISCHARGE PLAN: Attend aftercare/continuing care group Placement in alternative living arrangements  PATIENT/FAMILY INVOLVEMENT: This treatment plan has been presented to and reviewed with the patient, Sara Vazquez, and/or family member, .  The patient and family have been given the opportunity to ask questions and make suggestions.  Juris Gosnell, [place] University[place]ok [place], [place]RN 06/13/2017, 8:20 PM

## 2017-06-13 NOTE — ED Notes (Signed)
[employer] called for transport 

## 2017-06-13 NOTE — ED Notes (Signed)
SBAR Report received from previous nurse. Pt received calm and visible on unit. Pt denies current SI/ HI, A/V H, depression, anxiety, or pain at this time, and appears otherwise stable and free of distress and asked when she will be able to leave. Pt reminded of camera surveillance, q 15 min rounds, and rules of the milieu. Will continue to assess.

## 2017-06-13 NOTE — BHH Counselor (Signed)
TC from [facility]. Pt has been declined b/c they don't accept pt's between ages 19 - 49.   Sara Kathol Paige Shun Pletz, LCSW Therapeutic Triage Specialist  
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
## 2017-06-14 DIAGNOSIS — R450 Nervousness: Secondary | ICD-10-CM

## 2017-06-14 DIAGNOSIS — F1290 Cannabis use, unspecified, uncomplicated: Secondary | ICD-10-CM

## 2017-06-14 DIAGNOSIS — F332 Major depressive disorder, recurrent severe without psychotic features: Secondary | ICD-10-CM

## 2017-06-14 DIAGNOSIS — F419 Anxiety disorder, unspecified: Secondary | ICD-10-CM

## 2017-06-14 DIAGNOSIS — F1490 Cocaine use, unspecified, uncomplicated: Secondary | ICD-10-CM

## 2017-06-14 DIAGNOSIS — F17210 Nicotine dependence, cigarettes, uncomplicated: Secondary | ICD-10-CM

## 2017-06-14 DIAGNOSIS — G4700 Insomnia, unspecified: Secondary | ICD-10-CM

## 2017-06-14 DIAGNOSIS — F1994 Other psychoactive substance use, unspecified with psychoactive substance-induced mood disorder: Secondary | ICD-10-CM

## 2017-06-14 MED ORDER — HALOPERIDOL 5 MG PO TABS
5.0000 mg | ORAL_TABLET | Freq: Two times a day (BID) | ORAL | Status: DC
  Administered 2017-06-14 – 2017-06-16 (×4): 5 mg via ORAL
  Filled 2017-06-14 (×9): qty 1

## 2017-06-14 MED ORDER — LITHIUM CARBONATE ER 300 MG PO TBCR
600.0000 mg | EXTENDED_RELEASE_TABLET | Freq: Every day | ORAL | Status: DC
  Administered 2017-06-14 – 2017-06-17 (×4): 600 mg via ORAL
  Filled 2017-06-14 (×2): qty 2
  Filled 2017-06-14: qty 14
  Filled 2017-06-14: qty 2
  Filled 2017-06-14: qty 14
  Filled 2017-06-14 (×2): qty 2

## 2017-06-14 NOTE — [facility] Group Notes (Signed)
[facility] Group Notes:  Skills Group  Date:  06/14/2017  Time:  4:41 PM  Type of Therapy:  Psychoeducational Skills  Participation Level:  None  Participation Quality:  Inattentive  Affect:  Blunted  Cognitive:  Hallucinating  Insight:  Lacking  Engagement in Group:  Poor  Modes of Intervention:  Activity, Discussion and Education  Summary of Progress/Problems: Patient is seen sitting in a chair with her body turned away from the speaker. She is looking out the window at the snow falling. She is not engaged in group at this time.   Marzetta BoardDopson, Alaiza Yau E 06/14/2017, 4:41 PM
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
## 2017-06-14 NOTE — BHH Counselor (Signed)
Clinical Social Work Note  PSA attempted but patient too disorganized to give meaningful information.  To be tried again at a later date.  Ambrose MantleMareida Grossman-Orr, LCSW 06/14/2017, 4:36 PM
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
## 2017-06-14 NOTE — Progress Notes (Signed)
Patient ID: Sara Vazquez, female   DOB: [birth year], 36 y.o.   MRN: [id number]  Patient was seen by writer standing at the nurses station by herself, laughing in an inappropriate manner. She appeared preoccupied at this time.

## 2017-06-14 NOTE — [facility] Group Notes (Signed)
[facility]  Date/Time:  06/14/2017 10:00-11:00AM  Type of Therapy and Topic:  Group Therapy:  Healthy and Unhealthy Supports  Participation Level:  Active   Description of Group:  Patients in this group were introduced to the idea of adding a variety of healthy supports to address the various needs in their lives. Patients identified and discussed what additional healthy supports could be helpful in their recovery and wellness after discharge in order to prevent future hospitalizations.   An emphasis was placed on using counselor, doctor, therapy groups, 12-step groups, and problem-specific support groups to expand supports.   Therapeutic Goals:   1)  discuss importance of adding supports to stay well once out of the hospital  2)  compare healthy versus unhealthy supports and identify some examples of each  3)  generate ideas and descriptions of healthy supports that can be added  4)  offer mutual support about how to address unhealthy supports  5)  encourage active participation in and adherence to discharge plan    Summary of Patient Progress:  The patient participated fully in the discussion and expressed frustration toward the end of group that she needs to get phone numbers from her phone, started becoming disruptive in group.  She eventually asked to leave group and complained that "others aren't required to attend."  When asked to be quiet at one point, she started bouncing a ball disruptively.  She was encouraged to leave, then a few minutes later stuck her head back in the door to ask if anyone had a specific kind of charge cord, interrupting the entire group in the process.   Therapeutic Modalities:   Motivational Interviewing Brief Solution-Focused Therapy  Ambrose MantleMareida Grossman-Orr, LCSW 06/14/2017, 11:00AM

## 2017-06-14 NOTE — Progress Notes (Signed)
D) Pt did not sleep well last night. Remains paranoid. Asked this morning if because of the snow, will things slow down and "will I be safe?". On pt's self inventory was disorganized in her answers and responses.. Verbalized that people are conspiring against her and she is frightened. Has flight of ideas with pressured speech. Reports that she is bipolar and is manic. Wants to get back on her medications. "I am here to be evaluated after being in prison. I want my kids back. I am a grandmother now".   A) Provided with numerous 1:1's. Provided with boundaries and reassurance. Gently approach used with Pt. Assured Pt that this writer would be available to her at all times.  R) Pt is labile. Remains disorganized with hypomanic symptoms.

## 2017-06-15 DIAGNOSIS — F332 Major depressive disorder, recurrent severe without psychotic features: Secondary | ICD-10-CM | POA: Insufficient documentation

## 2017-06-15 NOTE — [facility] Suicide Risk Assessment (Addendum)
[facility] INPATIENT:  Family/Significant Other Suicide Prevention Education  Suicide Prevention Education:  Education Completed; Joellyn QuailsJoel Moorefield, father, [phone], has been identified by the patient as the family member/significant other with whom the patient will be residing, and identified as the person(s) who will aid the patient in the event of a mental health crisis (suicidal ideations/suicide attempt).  With written consent from the patient, the family member/significant other has been provided the following suicide prevention education, prior to the and/or following the discharge of the patient.  The suicide prevention education provided includes the following:  Suicide risk factors  Suicide prevention and interventions  National Suicide Hotline telephone number  [facility] assessment telephone number  [facility] Emergency Assistance 911  [facility] and/or Residential Mobile Crisis Unit telephone number  Request made of family/significant other to:  Remove weapons (e.g., guns, rifles, knives), all items previously/currently identified as safety concern.  Francis DowseJoel is not aware that pt has any guns.  Remove drugs/medications (over-the-counter, prescriptions, illicit drugs), all items previously/currently identified as a safety concern.  The family member/significant other verbalizes understanding of the suicide prevention education information provided.  The family member/significant other agrees to remove the items of safety concern listed above.  Joes reports that pt had [facility] and told him she had a "meltdown."  He talks to her regularly, although she often does not have a phone and he has to wait for her to call.  Pt has been unstable long term: homeless, has bipolar disorder, was in jail recently. He will continue to support her as he can.  He stays in an assisted living facility and can't help a lot other than phone calls.  Lorri FrederickWierda,  Jahlisa Rossitto Jon, LCSW 06/15/2017, 10:44 AM
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
## 2017-06-15 NOTE — Tx Team (Signed)
Interdisciplinary Treatment and Diagnostic Plan Update  06/15/2017 Time of Session: 1445 Sara Vazquez MRN: [id number]  Principal Diagnosis: Bipolar disorder [facility](HCC)  Secondary Diagnoses: Principal Problem:   Bipolar disorder (HCC) Active Problems:   MDD (major depressive disorder), recurrent severe, without psychosis (HCC)   Current Medications:  Current Facility-Administered Medications  Medication Dose Route Frequency Provider Last Rate Last Dose  . acetaminophen (TYLENOL) tablet 650 mg  650 mg Oral Q4H PRN Laveda AbbeParks, Laurie Britton, NP   650 mg at 06/15/17 1031  . alum & mag hydroxide-simeth (MAALOX/MYLANTA) 200-200-20 MG/5ML suspension 30 mL  30 mL Oral Q4H PRN Laveda AbbeParks, Laurie Britton, NP      . haloperidol (HALDOL) tablet 5 mg  5 mg Oral BID Izediuno, Delight OvensVincent A, MD   5 mg at 06/15/17 1003  . hydrOXYzine (ATARAX/VISTARIL) tablet 25 mg  25 mg Oral TID PRN Laveda AbbeParks, Laurie Britton, NP      . lithium carbonate (LITHOBID) CR tablet 600 mg  600 mg Oral QHS Izediuno, Delight OvensVincent A, MD   600 mg at 06/14/17 2238  . magnesium hydroxide (MILK OF MAGNESIA) suspension 30 mL  30 mL Oral Daily PRN Laveda AbbeParks, Laurie Britton, NP      . nicotine (NICODERM CQ - dosed in mg/24 hours) patch 21 mg  21 mg Transdermal Daily Laveda AbbeParks, Laurie Britton, NP   21 mg at 06/15/17 1003  . ondansetron (ZOFRAN) tablet 4 mg  4 mg Oral Q8H PRN Laveda AbbeParks, Laurie Britton, NP      . traZODone (DESYREL) tablet 100 mg  100 mg Oral QHS Laveda AbbeParks, Laurie Britton, NP   100 mg at 06/14/17 2238   PTA Medications: Medications Prior to Admission  Medication Sig Dispense Refill Last Dose  . amphetamine-dextroamphetamine (ADDERALL) 10 MG tablet Take 10 mg by mouth daily with breakfast.   Past Month at Unknown time  . haloperidol (HALDOL) 2 MG tablet Take 2 mg by mouth every 8 (eight) hours as needed for agitation.   Past Month at Unknown time  . HYDROcodone-acetaminophen (NORCO/VICODIN) 5-325 MG tablet Take 1 tablet by mouth every 6 (six)  hours as needed for severe pain. (Patient not taking: Reported on 06/12/2017) 5 tablet 0 Completed Course at Unknown time  . ibuprofen (ADVIL,MOTRIN) 600 MG tablet Take 1 tablet (600 mg total) by mouth every 8 (eight) hours as needed. (Patient not taking: Reported on 06/12/2017) 30 tablet 0 Completed Course at Unknown time  . lithium 600 MG capsule Take 600 mg by mouth 3 (three) times daily with meals.   Past Month at Unknown time    Patient Stressors: Marital or family conflict Medication change or noncompliance Substance abuse  Patient Strengths: Ability for insight Average or above average intelligence Capable of independent living General fund of knowledge Motivation for treatment/growth  Treatment Modalities: Medication Management, Group therapy, Case management,  1 to 1 session with clinician, Psychoeducation, Recreational therapy.   Physician Treatment Plan for Primary Diagnosis: Bipolar disorder (HCC) Long Term Goal(s): Improvement in symptoms so as ready for discharge Improvement in symptoms so as ready for discharge   Short Term Goals: Ability to identify changes in lifestyle to reduce recurrence of condition will improve Ability to identify and develop effective coping behaviors will improve Compliance with prescribed medications will improve Ability to disclose and discuss suicidal ideas Ability to identify and develop effective coping behaviors will improve Compliance with prescribed medications will improve  Medication Management: Evaluate patient's response, side effects, and tolerance of medication regimen.  Therapeutic Interventions: 1 to  1 sessions, Unit Group sessions and Medication administration.  Evaluation of Outcomes: Progressing  Physician Treatment Plan for Secondary Diagnosis: Principal Problem:   Bipolar disorder (HCC) Active Problems:   MDD (major depressive disorder), recurrent severe, without psychosis (HCC)  Long Term Goal(s): Improvement in  symptoms so as ready for discharge Improvement in symptoms so as ready for discharge   Short Term Goals: Ability to identify changes in lifestyle to reduce recurrence of condition will improve Ability to identify and develop effective coping behaviors will improve Compliance with prescribed medications will improve Ability to disclose and discuss suicidal ideas Ability to identify and develop effective coping behaviors will improve Compliance with prescribed medications will improve     Medication Management: Evaluate patient's response, side effects, and tolerance of medication regimen.  Therapeutic Interventions: 1 to 1 sessions, Unit Group sessions and Medication administration.  Evaluation of Outcomes: Progressing   RN Treatment Plan for Primary Diagnosis: Bipolar disorder (HCC) Long Term Goal(s): Knowledge of disease and therapeutic regimen to maintain health will improve  Short Term Goals: Ability to identify and develop effective coping behaviors will improve and Compliance with prescribed medications will improve  Medication Management: RN will administer medications as ordered by provider, will assess and evaluate patient's response and provide education to patient for prescribed medication. RN will report any adverse and/or side effects to prescribing provider.  Therapeutic Interventions: 1 on 1 counseling sessions, Psychoeducation, Medication administration, Evaluate responses to treatment, Monitor vital signs and CBGs as ordered, Perform/monitor CIWA, COWS, AIMS and Fall Risk screenings as ordered, Perform wound care treatments as ordered.  Evaluation of Outcomes: Progressing   LCSW Treatment Plan for Primary Diagnosis: Bipolar disorder (HCC) Long Term Goal(s): Safe transition to appropriate next level of care at discharge, Engage patient in therapeutic group addressing interpersonal concerns.  Short Term Goals: Engage patient in aftercare planning with referrals and  resources, Increase social support and Increase skills for wellness and recovery  Therapeutic Interventions: Assess for all discharge needs, 1 to 1 time with Social worker, Explore available resources and support systems, Assess for adequacy in community support network, Educate family and significant other(s) on suicide prevention, Complete Psychosocial Assessment, Interpersonal group therapy.  Evaluation of Outcomes: Progressing   Progress in Treatment: Attending groups: Yes. Participating in groups: Yes. Taking medication as prescribed: Yes. Toleration medication: Yes. Family/Significant other contact made: Yes, individual(s) contacted:  father Patient understands diagnosis: Yes. Discussing patient identified problems/goals with staff: Yes. Medical problems stabilized or resolved: Yes. Denies suicidal/homicidal ideation: Yes. Issues/concerns per patient self-inventory: N0 Other: none  New problem(s) identified: No, Describe:  none  New Short Term/Long Term Goal(s):  Discharge Plan or Barriers:   Reason for Continuation of Hospitalization: Medication stabilization  Estimated Length of Stay: 06/20/17  Attendees: Patient: 06/15/2017   Physician: Dr Jama Flavorsobos 06/15/2017   Nursing: Joslyn Devonaroline Beaudry, RN 06/15/2017   RN Care Manager: 06/15/2017   Social Worker: Daleen SquibbGreg Alexus Michael, LCSW 06/15/2017   Recreational Therapist:  06/15/2017   Other:  06/15/2017   Other:  06/15/2017   Other: 06/15/2017        Scribe for Treatment Team: Lorri FrederickWierda, Keyah Blizard Jon, LCSW 06/15/2017 2:45 PM

## 2017-06-15 NOTE — Progress Notes (Signed)
[facility] Group Notes:  (Nursing/MHT/Case Management/Adjunct)  Date:  06/15/2017  Time:  [id number]  Nursing Group  Patient invited to group but did not attend.

## 2017-06-15 NOTE — Progress Notes (Signed)
Psychoeducational Group Note  Date:  06/15/2017 Time: 2047  Group Topic/Focus:  Wrap-Up Group:   The focus of this group is to help patients review their daily goal of treatment and discuss progress on daily workbooks.  Participation Level: Did Not Attend  Participation Quality:  Not Applicable  Affect:  Not Applicable  Cognitive:  Not Applicable  Insight:  Not Applicable  Engagement in Group: Not Applicable  Additional Comments:  The patient did not attend group this evening.   Hazle CocaGOODMAN, Alcus Bradly S 06/15/2017, 8:47 PM

## 2017-06-15 NOTE — Plan of Care (Signed)
  Progressing Activity: Sleeping patterns will improve 06/15/2017 1625 - Progressing by Angela AdamBeaudry, Sherell Christoffel E, RN Coping: Ability to demonstrate self-control will improve 06/15/2017 1625 - Progressing by Angela AdamBeaudry, Isela Stantz E, RN

## 2017-06-15 NOTE — Progress Notes (Signed)
D: Patient has been pleasant with staff today.  She has an irritable edge at times, however, she is interacting well.  She is worried about her upcoming court dates.  She was able to get in touch with her probation officer and she states that made her feel better.  She rates her depression as a 3; hopelessness as a 2; denies any anxiety.  She is complaining of a sore throat today and she was given tylenol and cepacol with good results.  She denies any thoughts of self harm.  She wants to work on her "discharge plan" today.    A: Continue to monitor medication management and MD orders.  Safety checks completed every 15 minutes per protocol.  Offer support and encouragement as needed.  R: Patient is receptive to staff; her behavior is appropriate.

## 2017-06-15 NOTE — Progress Notes (Signed)
Patient ID: Sara Vazquez, female   DOB: 08-18-1980, 36 y.o.   MRN: [id number]  DAR Note: Pt observed in room resting with eyes closed. Pt at assessment complained of moderate anxiety; "I have been homeless for some time now and there is someone messing with my things" Pt denied depression, SI, HI or AVH; "I don't have all that. Pt continues to be restless-changed sleeping position several times. Pt was med compliant. All patient's questions and concerns addressed. Support, encouragement, and safe environment provided. 15-minute safety checks continue.
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
## 2017-06-16 DIAGNOSIS — F3170 Bipolar disorder, currently in remission, most recent episode unspecified: Secondary | ICD-10-CM

## 2017-06-16 DIAGNOSIS — R451 Restlessness and agitation: Secondary | ICD-10-CM

## 2017-06-16 DIAGNOSIS — F1210 Cannabis abuse, uncomplicated: Secondary | ICD-10-CM

## 2017-06-16 DIAGNOSIS — F39 Unspecified mood [affective] disorder: Secondary | ICD-10-CM

## 2017-06-16 DIAGNOSIS — F1410 Cocaine abuse, uncomplicated: Secondary | ICD-10-CM

## 2017-06-16 LAB — CBC WITH DIFFERENTIAL/PLATELET
BASOS ABS: 0 10*3/uL (ref 0.0–0.1)
BASOS PCT: 0 %
EOS PCT: 2 %
Eosinophils Absolute: 0.2 10*3/uL (ref 0.0–0.7)
HCT: 36.4 % (ref 36.0–46.0)
Hemoglobin: 11.6 g/dL — ABNORMAL LOW (ref 12.0–15.0)
Lymphocytes Relative: 25 %
Lymphs Abs: 2.5 10*3/uL (ref 0.7–4.0)
MCH: 29.5 pg (ref 26.0–34.0)
MCHC: 31.9 g/dL (ref 30.0–36.0)
MCV: 92.6 fL (ref 78.0–100.0)
MONO ABS: 0.6 10*3/uL (ref 0.1–1.0)
Monocytes Relative: 6 %
Neutro Abs: 6.6 10*3/uL (ref 1.7–7.7)
Neutrophils Relative %: 67 %
PLATELETS: 283 10*3/uL (ref 150–400)
RBC: 3.93 MIL/uL (ref 3.87–5.11)
RDW: 13.4 % (ref 11.5–15.5)
WBC: 10 10*3/uL (ref 4.0–10.5)

## 2017-06-16 MED ORDER — HALOPERIDOL 5 MG PO TABS: 5.0000 mg | ORAL_TABLET | Freq: Two times a day (BID) | ORAL | Status: DC | PRN

## 2017-06-16 MED ORDER — ARIPIPRAZOLE 5 MG PO TABS
5.0000 mg | ORAL_TABLET | Freq: Every day | ORAL | Status: DC
  Administered 2017-06-16 – 2017-06-17 (×2): 5 mg via ORAL
  Filled 2017-06-16 (×5): qty 1

## 2017-06-16 NOTE — Progress Notes (Signed)
D: Patient has been in bed all morning.  She did not come up for her morning medications.  She is lethargic and drowsy.  Patient's mood was upbeat yesterday because she found out she is getting a car and transportation was a big issue for her to see her children.  She denies any thoughts of self harm.  Patient has been encouraged to attend group.  Patient's affect is flat, blunted; her mood is stable.    A: Continue to monitor medication management and MD orders.  Safety checks continued every 15 minutes per protocol.  Offer support and encouragement as needed.  R: Patient has minimal interaction with staff.  Her behavior is appropriate.

## 2017-06-16 NOTE — Progress Notes (Signed)
Pt has been in the bed all evening.  She reports that she has been drowsy today and attributes it to either the Abilify she has been started on or the weather.  She denies SI/HI/AVH.  She voices no needs or concerns at this time.  She states she otherwise had a good day although she described it as "boring".  She did not attend evening group, but did get up later to take her evening meds.  At first she declined her Trazodone, but then decided to take it.  She is hopeful to discharge soon.  Support and encouragement offered.  Discharge plans are in process.  Safety maintained with q15 minute checks.

## 2017-06-16 NOTE — Progress Notes (Signed)
[facility]  06/16/2017 2:21 PM Sara Vazquez  MRN:  [id number]   Subjective:  Patient reports that she has just felt too tired with taking the Haldol BID when she use to take it PRN. "I don't want to lay around all day." She is focused on getting her Adderall prescribed again and she details a lengthy story about being diagnosed as a child with ADHD. Patient denies any SI/HI/AVH and contracts for safety.  Objective: Patient's chart and findings reviewed and discussed with treatment team. Patient agrees to try Abilify and will start at 5 mg and titrate as need. Will continue the Lithobid. Will change the Haldol to PRN, but will not continue it after discharge. Patient is noncompliant ans she made the comment that she is not a morning person and [facility] in [facility] so she use Methamphetamines to self medicate for her not having Adderall.    Principal Problem: Bipolar disorder (HCC) Diagnosis:   Patient Active Problem List   Diagnosis Date Noted  . MDD (major depressive disorder), recurrent severe, without psychosis (HCC) [F33.2]   . Substance induced mood disorder (HCC) [F19.94] 06/13/2017  . Polysubstance abuse (HCC) [F19.10] 06/13/2017  . Bipolar disorder (HCC) [F31.9] 06/13/2017  . Suicidal ideation [R45.851]    Total Time spent with patient: 25 minutes  Past Psychiatric History: See H&P  Past Medical History:  Past Medical History:  Diagnosis Date  . Kidney stones     Past Surgical History:  Procedure Laterality Date  . ABDOMINAL SURGERY    . TONSILLECTOMY     Family History: History reviewed. No pertinent family history. Family Psychiatric  History: See H&P Social History:  Social History   Substance and Sexual Activity  Alcohol Use Yes   Comment: occasional     Social History   Substance and Sexual Activity  Drug Use Yes  . Types: Marijuana, Cocaine, Methamphetamines    Social  History   Socioeconomic History  . Marital status: Single    Spouse name: None  . Number of children: None  . Years of education: None  . Highest education level: None  Social Needs  . Financial resource strain: None  . Food insecurity - worry: None  . Food insecurity - inability: None  . Transportation needs - medical: None  . Transportation needs - non-medical: None  Occupational History  . None  Tobacco Use  . Smoking status: Current Every Day Smoker    Types: Cigarettes  . Smokeless tobacco: Never Used  Substance and Sexual Activity  . Alcohol use: Yes    Comment: occasional  . Drug use: Yes    Types: Marijuana, Cocaine, Methamphetamines  . Sexual activity: Yes    Birth control/protection: Surgical  Other Topics Concern  . None  Social History Narrative  . None   Additional Social History:                         Sleep: Good  Appetite:  Good  Current Medications: Current Facility-Administered Medications  Medication Dose Route Frequency Provider Last Rate Last Dose  . acetaminophen (TYLENOL) tablet 650 mg  650 mg Oral Q4H PRN Laveda AbbeParks, Laurie Britton, NP   650 mg at 06/16/17 1211  . alum & mag hydroxide-simeth (MAALOX/MYLANTA) 200-200-20 MG/5ML suspension 30 mL  30 mL Oral Q4H PRN Laveda AbbeParks, Laurie Britton, NP      . ARIPiprazole (ABILIFY) tablet  5 mg  5 mg Oral Daily Money, Travis B, FNP      . haloperidol (HALDOL) tablet 5 mg  5 mg Oral BID PRN Money, Gerlene Burdock, FNP      . hydrOXYzine (ATARAX/VISTARIL) tablet 25 mg  25 mg Oral TID PRN Laveda Abbe, NP      . lithium carbonate (LITHOBID) CR tablet 600 mg  600 mg Oral QHS Izediuno, Delight Ovens, MD   600 mg at 06/15/17 2240  . magnesium hydroxide (MILK OF MAGNESIA) suspension 30 mL  30 mL Oral Daily PRN Laveda Abbe, NP      . nicotine (NICODERM CQ - dosed in mg/24 hours) patch 21 mg  21 mg Transdermal Daily Laveda Abbe, NP   21 mg at 06/15/17 1003  . ondansetron (ZOFRAN) tablet 4 mg  4  mg Oral Q8H PRN Laveda Abbe, NP      . traZODone (DESYREL) tablet 100 mg  100 mg Oral QHS Laveda Abbe, NP   100 mg at 06/15/17 2240    Lab Results: No results found for this or any previous visit (from the past 48 hour(s)).  Blood Alcohol level:  Lab Results  Component Value Date   ETH <10 06/12/2017    Metabolic Disorder Labs: No results found for: HGBA1C, MPG No results found for: PROLACTIN No results found for: CHOL, TRIG, HDL, CHOLHDL, VLDL, LDLCALC  Physical Findings: AIMS: Facial and Oral Movements Muscles of Facial Expression: None, normal Lips and Perioral Area: None, normal Jaw: None, normal Tongue: None, normal,Extremity Movements Upper (arms, wrists, hands, fingers): None, normal Lower (legs, knees, ankles, toes): None, normal, Trunk Movements Neck, shoulders, hips: None, normal, Overall Severity Severity of abnormal movements (highest score from questions above): None, normal Incapacitation due to abnormal movements: None, normal Patient's awareness of abnormal movements (rate only patient's report): No Awareness, Dental Status Current problems with teeth and/or dentures?: No Does patient usually wear dentures?: No  CIWA:    COWS:     Musculoskeletal: Strength & Muscle Tone: within normal limits Gait & Station: normal Patient leans: N/A  Psychiatric Specialty Exam: Physical Exam  ROS  Blood pressure 107/62, pulse (!) 58, temperature 98 F (36.7 C), temperature source Oral, resp. rate 16, height 5\' 7"  (1.702 m), weight 59.4 kg (131 lb), last menstrual period 05/22/2017.Body mass index is 20.52 kg/m.  General Appearance: Disheveled  Eye Contact:  Good  Speech:  Clear and Coherent and Normal Rate  Volume:  Normal  Mood:  Anxious  Affect:  Congruent  Thought Process:  Goal Directed and Descriptions of Associations: Intact  Orientation:  Full (Time, Place, and Person)  Thought Content:  WDL  Suicidal Thoughts:  No  Homicidal Thoughts:   No  Memory:  Immediate;   Good Recent;   Good Remote;   Good  Judgement:  Fair  Insight:  Good  Psychomotor Activity:  Normal  Concentration:  Concentration: Good and Attention Span: Good  Recall:  Good  Fund of Knowledge:  Good  Language:  Good  Akathisia:  No  Handed:  Right  AIMS (if indicated):     Assets:  Communication Skills Desire for Improvement Financial Resources/Insurance Housing Physical Health Social Support Transportation  ADL's:  Intact  Cognition:  WNL  Sleep:  Number of Hours: 6.75   Problems Addressed:  Bipolar Polysubstance abuse SUD  Treatment Plan Summary: Daily contact with patient to assess and evaluate symptoms and progress in treatment, Medication management and Plan is to:  -  Start Abilify 5 mg PO Daily for mood stability -Continue Lithobid CR 600 mg PO QHS for mood stability -Change Haldol 5 mg PO BID PRN for agitation -Continue Trazodone 100 mg PO QHS for insomnia -Encourage group therapy participation -Continue Vistaril 25 mg PO TID PRN for anxiety  Maryfrances Bunnellravis B Money, FNP 06/16/2017, 2:21 PM   Agree with NP Progress Note

## 2017-06-16 NOTE — [facility] Group Notes (Signed)
Adult Psychoeducational Group Note  Date:  06/16/2017 Time:  [id number]  Psychoeducational nursing group Patient invited to group but did not attend

## 2017-06-16 NOTE — Progress Notes (Signed)
D:  Sara Vazquez has been in her bed most of the shift.  She did not attend evening wrap up group.  She denies any SI/HI or A/V hallucination.  She took her medication without difficulty.  She did say that she had an OK day and no complaints voiced at this time.  She denies any pain or discomfort and appears to be in no physical distress. A:  1:1 with RN for support and encouragement.  Medications as ordered.  Encouraged continued participation in group and unit activities.  Q 15 minute checks maintained for safety. R:  She remains safe on the unit.  We will continue to monitor the progress towards her goals.

## 2017-06-16 NOTE — Plan of Care (Signed)
Pt denies si thoughts and is taking medications as prescribed.

## 2017-06-17 LAB — LITHIUM LEVEL: Lithium Lvl: 0.32 mmol/L — ABNORMAL LOW (ref 0.60–1.20)

## 2017-06-17 MED ORDER — ARIPIPRAZOLE 10 MG PO TABS
10.0000 mg | ORAL_TABLET | Freq: Every day | ORAL | Status: DC
  Administered 2017-06-18: 10 mg via ORAL
  Filled 2017-06-17 (×2): qty 7
  Filled 2017-06-17 (×2): qty 1

## 2017-06-17 NOTE — Progress Notes (Signed)
Recreation Therapy Notes  Date: 06/17/17 Time: 0930 Location: [address] Dayroom  Group Topic: Stress Management  Goal Area(s) Addresses:  Patient will verbalize importance of using healthy stress management.  Patient will identify positive emotions associated with healthy stress management.   Intervention: Stress Management  Activity :  Guided Imagery.  LRT introduced the stress management technique of guided imagery.  LRT read Vazquez script that allowed patients to envision their peaceful place.  Patients were to follow along as script was read.  Education:  Stress Management, Discharge Planning.   Education Outcome: Acknowledges edcuation/In group clarification offered/Needs additional education  Clinical Observations/Feedback: Pt did not attend group.    Caroll RancherMarjette Shonique Vazquez, LRT/CTRS         Caroll RancherLindsay, Sara Vazquez 06/17/2017 12:18 PM

## 2017-06-17 NOTE — Progress Notes (Signed)
D:Pt has been in her bed much of the day. Pt did not attend group today when she out of her room and multiple staff members encouraged her to attend. Pt is focused on discharge. She rates depression as a 1 and anxiety as a 2 on 0-10 scale with 10 being the most.  A:Offered support, encouragement and 15 minute checks.  R:Pt denies si and hi. Safety maintained on the unit.
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
## 2017-06-17 NOTE — Progress Notes (Signed)
Pt continues to stay to herself, mostly staying in her room in bed, although not always asleep.  She denies SI/HI/AVH.  She hopes to discharge tomorrow, and says that she is going to stay with a friend that [address]  When asked who would be picking her up, she replied that she would probably walk there.  Pt was encouraged to speak to the CSW about transportation.  Pt has been pleasant and cooperative.  Pt voices no needs or concerns at this time.  Support and encouragement offered.  Discharge plans are in process.  Safety maintained with q15 minute checks.

## 2017-06-18 MED ORDER — ARIPIPRAZOLE 10 MG PO TABS: 10.0000 mg | ORAL_TABLET | Freq: Every day | ORAL | 0 refills | Status: DC

## 2017-06-18 MED ORDER — TRAZODONE HCL 100 MG PO TABS: 100.0000 mg | ORAL_TABLET | Freq: Every day | ORAL | 0 refills | Status: DC

## 2017-06-18 MED ORDER — LITHIUM CARBONATE ER 300 MG PO TBCR: 600.0000 mg | EXTENDED_RELEASE_TABLET | Freq: Every day | ORAL | 0 refills | Status: DC

## 2017-06-18 MED ORDER — HYDROXYZINE HCL 25 MG PO TABS: 25.0000 mg | ORAL_TABLET | Freq: Three times a day (TID) | ORAL | 0 refills | Status: DC | PRN

## 2017-06-18 NOTE — Progress Notes (Signed)
  [facility] :  Will you be returning to the same living situation after discharge:  No.  Pt plans to live with a friend in [place] at discharge. At discharge, do you have transportation home?: Yes,  bus pass provided. Do you have the ability to pay for your medications: Yes,  mental health  Release of information consent forms completed and submitted to medical records by CSW.   Patient to Follow up at: Follow-up Information    [facility] Follow up on 06/24/2017.   Specialty:  Behavioral Health Why:  Hospital follow-up on Wednesday, 12/19 at 8:30AM. Please arrive by 8:15AM for check-in. Thank you.  Contact information: [address] [place] [address] [id number]           Next level of care provider has access to [facility] Link:no  Safety Planning and Suicide Prevention discussed: Yes,  SPE completed with pt's father. SPI pamphlet and Mobile Crisis information also provided to pt.   Have you used any form of tobacco in the last 30 days? (Cigarettes, Smokeless Tobacco, Cigars, and/or Pipes): Yes  Has patient been referred to the Quitline?: Patient refused referral  Patient has been referred for addiction treatment: Yes  Pulte HomesHeather N Smart, LCSW 06/18/2017, 8:48 AM

## 2017-06-18 NOTE — Progress Notes (Addendum)
Per patient request, CSW left message for probation officer Turner at [phone] asking for fax number to send hospital note. Pt requested she be provided with hospital note for Officer Turner including dates of admission; mental health diagnosis, treatment while in the hospital (medication and group therapy). CSW provided pt with this note.  Trula SladeHeather Smart, MSW, LCSW Clinical Social Worker 06/18/2017 1:37 PM   Hospital note with above information faxed to: [phone] attn Officer Turner at pt's request. Consent form also signed by pt.   Trula SladeHeather Smart, MSW, LCSW Clinical Social Worker 06/18/2017 3:52 PM

## 2017-06-18 NOTE — BHH Suicide Risk Assessment (Addendum)
[facility]   Principal Problem: Bipolar disorder [facility](HCC) Discharge Diagnoses:  Patient Active Problem List   Diagnosis Date Noted  . MDD (major depressive disorder), recurrent severe, without psychosis (HCC) [F33.2]   . Substance induced mood disorder (HCC) [F19.94] 06/13/2017  . Polysubstance abuse (HCC) [F19.10] 06/13/2017  . Bipolar disorder (HCC) [F31.9] 06/13/2017  . Suicidal ideation [R45.851]     Total Time spent with patient: 30 minutes  Musculoskeletal: Strength & Muscle Tone: within normal limits Gait & Station: normal Patient leans: N/A  Psychiatric Specialty Exam: ROS no headache, no chest pain, no dyspnea, no vomiting .   Blood pressure 104/62, pulse 90, temperature 98.7 F (37.1 C), resp. rate 18, height 5\' 7"  (1.702 m), weight 59.4 kg (131 lb), last menstrual period 05/22/2017.Body mass index is 20.52 kg/m.  General Appearance: Fairly Groomed  Patent attorneyye Contact::  improving   Speech:  Normal Rate409  Volume:  Normal  Mood:  reports feeling better, denies depression  Affect:  Appropriate and reactive   Thought Process:  Linear and Descriptions of Associations: Intact- more linear today  Orientation:  Other:  fully alert and attentive   Thought Content:  denies hallucinations, no delusions, not internally preoccupied   Suicidal Thoughts:  No denies suicidal ideations , denies self injurious ideations, no homicidal or violent ideations  Homicidal Thoughts:  No  Memory:  recent and remote grossly intact   Judgement:  Other:  improving   Insight:  Fair and improving   Psychomotor Activity:  Normal  Concentration:  Good  Recall:  Good  Fund of Knowledge:Good  Language: Good  Akathisia:  No  Handed:  Right  AIMS (if indicated):     Assets:  Communication Skills Desire for Improvement Resilience  Sleep:  Number of Hours: 6.75  Cognition: WNL  ADL's:  Intact   Mental Status Per Nursing Assessment::   On Admission:     Demographic  Factors:  36 year old separated female, has four children, who live with their fathers, homeless  Loss Factors: Homelessness, not being able to see her children often  Historical Factors: History of substance abuse, history of Bipolar Disorder diagnosis in the past , denies history of suicide attempts    Risk Reduction Factors:   Sense of responsibility to family and Positive coping skills or problem solving skills  Continued Clinical Symptoms:  At this time patient is alert, attentive, improved, states she feels better , denies feeling depressed, and presents euthymic, with appropriate, reactive affect . No thought disorder noted today. Denies suicidal ideations, no homicidal or violent ideations, no hallucinations, not internally preoccupied . No delusions expressed today.  No disruptive or agitated behaviors at this time, pleasant on approach. Denies medication side effects. Li level subtherapeutic at 0.32. We discussed , at this time she prefers to continue current Lithium dose, states she feels she responds well to current dose .   Cognitive Features That Contribute To Risk:  No gross cognitive deficits noted upon discharge. Is alert , attentive, and oriented x 3   Suicide Risk:  Mild:  Suicidal ideation of limited frequency, intensity, duration, and specificity.  There are no identifiable plans, no associated intent, mild dysphoria and related symptoms, good self-control (both objective and subjective assessment), few other risk factors, and identifiable protective factors, including available and accessible social support.  Follow-up Information    [facility] Follow up on 06/24/2017.   Specialty:  Behavioral Health Why:  Hospital follow-up on Wednesday, 12/19 at 8:30AM. Please  arrive by 8:15AM for check-in. Thank you.  Contact information: [address] [place] [address] [phone]           Plan Of Care/Follow-up recommendations:  Activity:  as tolerated  Diet:   regular Tests:  NA Other:  See below  Patient is requesting discharge and there are no current grounds for involuntary commitment  She is leaving unit in good spirits  States she plans to go live with a friend  Plans to follow up as above  We reviewed importance of sobriety, abstinence as integral part of treatment goals, and have encouraged her to consider NA attendance .  Craige CottaFernando A Cobos, MD 06/18/2017, 10:56 AM

## 2017-06-18 NOTE — BHH Suicide Risk Assessment (Signed)
Data. Patient denies SI/HI/AVH. Verbally contracts for safety on the unit and to come to staff before acting of any self harm thoughts.  Patient interacting well with staff and other patients, when up. Patient did sleep in this am. On her self assessment she reports 0/10 for depression and hopelessness and 1/10 for anxiety. Hr goal for today is, "Discharge." Action. Emotional support and encouragement offered. Education provided on medication, indications and side effect. Q 15 minute checks done for safety. Response. Safety on the unit maintained through 15 minute checks.  Medications taken as prescribed.  Remained calm and appropriate through out shift.  Pt. discharged to lobby.  Belongings sheet reviewed and signed by pt. and all belongings sent home, including medication samples, scripts and a taxi voucher. Paperwork reviewed and pt. able to verbalize understanding of education. Pt. in no current distress and ambulatory.
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
## 2017-06-18 NOTE — Progress Notes (Signed)
Adult PsPatient will benefit from ongoing skilled PT services in  to continue to advance safe functional mobility, address ongoing impairments in , and minimize fall risk.ychoeducational Group Note  Date:  06/18/2017 Time:  4:41 PM  Group Topic/Focus:  Goals Group:   The focus of this group is to help patients establish daily goals to achieve during treatment and discuss how the patient can incorporate goal setting into their daily lives to aide in recovery.  Participation Level:  Did Not Attend  Participation Quality:    Affect:    Cognitive:    Insight: None  Engagement in Group:    Modes of Intervention:    Additional Comments: Pt did not attend group  Sara BeersRodney S [name] 06/18/2017, 4:41 PM

## 2017-06-18 NOTE — Progress Notes (Signed)
Pt attend wrap up group. Her day was a 4. Her goal was to sleep today. She wanted to sleep the day away. She ate too much food not used to eating a lot of food. She plan to go home tomorrow.

## 2017-08-28 ENCOUNTER — Other Ambulatory Visit: Payer: Self-pay | Admitting: Internal Medicine

## 2017-10-19 ENCOUNTER — Other Ambulatory Visit: Payer: Self-pay

## 2017-10-19 ENCOUNTER — Encounter (HOSPITAL_COMMUNITY): Payer: Self-pay | Admitting: Emergency Medicine

## 2017-10-19 ENCOUNTER — Emergency Department (HOSPITAL_COMMUNITY)
Admission: EM | Admit: 2017-10-19 | Discharge: 2017-10-19 | Disposition: A | Discharge status: Home / Self Care | Payer: Self-pay | Attending: Emergency Medicine | Admitting: Emergency Medicine

## 2017-10-19 DIAGNOSIS — Y9389 Activity, other specified: Secondary | ICD-10-CM | POA: Insufficient documentation

## 2017-10-19 DIAGNOSIS — F17210 Nicotine dependence, cigarettes, uncomplicated: Secondary | ICD-10-CM | POA: Insufficient documentation

## 2017-10-19 DIAGNOSIS — S0990XA Unspecified injury of head, initial encounter: Secondary | ICD-10-CM

## 2017-10-19 DIAGNOSIS — W228XXA Striking against or struck by other objects, initial encounter: Secondary | ICD-10-CM | POA: Insufficient documentation

## 2017-10-19 DIAGNOSIS — S0101XA Laceration without foreign body of scalp, initial encounter: Secondary | ICD-10-CM | POA: Insufficient documentation

## 2017-10-19 DIAGNOSIS — Y9289 Other specified places as the place of occurrence of the external cause: Secondary | ICD-10-CM | POA: Insufficient documentation

## 2017-10-19 DIAGNOSIS — Y999 Unspecified external cause status: Secondary | ICD-10-CM | POA: Insufficient documentation

## 2017-10-19 DIAGNOSIS — Z79899 Other long term (current) drug therapy: Secondary | ICD-10-CM | POA: Insufficient documentation

## 2017-10-19 MED ORDER — LIDOCAINE HCL (PF) 1 % IJ SOLN
5.0000 mL | Freq: Once | INTRAMUSCULAR | Status: AC
  Administered 2017-10-19: 5 mL
  Filled 2017-10-19: qty 5

## 2017-10-19 MED ORDER — ACETAMINOPHEN 500 MG PO TABS
1000.0000 mg | ORAL_TABLET | Freq: Once | ORAL | Status: AC
  Administered 2017-10-19: 1000 mg via ORAL
  Filled 2017-10-19: qty 2

## 2017-10-19 NOTE — ED Notes (Signed)
Pt at desk asking if she can have something to eat, and wants something for pain
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
## 2017-10-19 NOTE — Discharge Instructions (Addendum)
1.  Your staples need to be removed in 7 to 10 days.  May go to urgent care or return to the emergency department or see your family doctor. 2.  Use the attached resource guides to find a family doctor and to continue counseling and treatment for psychiatric care.
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
## 2017-10-19 NOTE — ED Notes (Signed)
Gave pt a turkey sandwich and a cup of water. 

## 2017-10-19 NOTE — ED Notes (Signed)
Pt given Tylenol for pain, pt requesting Vicodin or Percocet
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
## 2017-10-28 ENCOUNTER — Other Ambulatory Visit: Payer: Self-pay

## 2017-10-28 ENCOUNTER — Emergency Department (HOSPITAL_COMMUNITY)
Admission: EM | Admit: 2017-10-28 | Discharge: 2017-10-28 | Payer: Self-pay | Attending: Emergency Medicine | Admitting: Emergency Medicine

## 2017-10-28 ENCOUNTER — Encounter (HOSPITAL_COMMUNITY): Payer: Self-pay | Admitting: Emergency Medicine

## 2017-10-28 ENCOUNTER — Emergency Department (HOSPITAL_COMMUNITY): Payer: Self-pay

## 2017-10-28 DIAGNOSIS — Z79899 Other long term (current) drug therapy: Secondary | ICD-10-CM | POA: Insufficient documentation

## 2017-10-28 DIAGNOSIS — Y939 Activity, unspecified: Secondary | ICD-10-CM | POA: Insufficient documentation

## 2017-10-28 DIAGNOSIS — Y929 Unspecified place or not applicable: Secondary | ICD-10-CM | POA: Insufficient documentation

## 2017-10-28 DIAGNOSIS — M79602 Pain in left arm: Secondary | ICD-10-CM | POA: Insufficient documentation

## 2017-10-28 DIAGNOSIS — Y998 Other external cause status: Secondary | ICD-10-CM | POA: Insufficient documentation

## 2017-10-28 DIAGNOSIS — F10920 Alcohol use, unspecified with intoxication, uncomplicated: Secondary | ICD-10-CM | POA: Insufficient documentation

## 2017-10-28 DIAGNOSIS — F17210 Nicotine dependence, cigarettes, uncomplicated: Secondary | ICD-10-CM | POA: Insufficient documentation

## 2017-10-28 DIAGNOSIS — S41142A Puncture wound with foreign body of left upper arm, initial encounter: Secondary | ICD-10-CM | POA: Insufficient documentation

## 2017-10-28 DIAGNOSIS — Z23 Encounter for immunization: Secondary | ICD-10-CM | POA: Insufficient documentation

## 2017-10-28 LAB — I-STAT CHEM 8, ED
BUN: 18 mg/dL (ref 6–20)
Calcium, Ion: 0.92 mmol/L — ABNORMAL LOW (ref 1.15–1.40)
Chloride: 111 mmol/L (ref 101–111)
Creatinine, Ser: 0.7 mg/dL (ref 0.44–1.00)
GLUCOSE: 79 mg/dL (ref 65–99)
HEMATOCRIT: 30 % — AB (ref 36.0–46.0)
HEMOGLOBIN: 10.2 g/dL — AB (ref 12.0–15.0)
POTASSIUM: 4.9 mmol/L (ref 3.5–5.1)
Sodium: 143 mmol/L (ref 135–145)
TCO2: 21 mmol/L — ABNORMAL LOW (ref 22–32)

## 2017-10-28 LAB — I-STAT BETA HCG BLOOD, ED (MC, WL, AP ONLY)

## 2017-10-28 LAB — ETHANOL: ALCOHOL ETHYL (B): 108 mg/dL — AB (ref <10)

## 2017-10-28 MED ORDER — MORPHINE SULFATE (PF) 4 MG/ML IV SOLN
4.0000 mg | Freq: Once | INTRAVENOUS | Status: AC
  Administered 2017-10-28: 4 mg via INTRAVENOUS
  Filled 2017-10-28: qty 1

## 2017-10-28 MED ORDER — NAPROXEN 500 MG PO TABS: 500.0000 mg | ORAL_TABLET | Freq: Two times a day (BID) | ORAL | 0 refills | Status: DC

## 2017-10-28 MED ORDER — OXYCODONE-ACETAMINOPHEN 5-325 MG PO TABS
2.0000 | ORAL_TABLET | Freq: Once | ORAL | Status: AC
  Administered 2017-10-28: 2 via ORAL
  Filled 2017-10-28: qty 2

## 2017-10-28 MED ORDER — KETOROLAC TROMETHAMINE 30 MG/ML IJ SOLN
30.0000 mg | Freq: Once | INTRAMUSCULAR | Status: AC
Start: 2017-10-28 — End: 2017-10-28
  Administered 2017-10-28: 30 mg via INTRAVENOUS
  Filled 2017-10-28: qty 1

## 2017-10-28 MED ORDER — SODIUM CHLORIDE 0.9 % IV BOLUS
1000.0000 mL | Freq: Once | INTRAVENOUS | Status: AC
  Administered 2017-10-28: 1000 mL via INTRAVENOUS

## 2017-10-28 MED ORDER — TETANUS-DIPHTH-ACELL PERTUSSIS 5-2.5-18.5 LF-MCG/0.5 IM SUSP
0.5000 mL | Freq: Once | INTRAMUSCULAR | Status: AC
  Administered 2017-10-28: 0.5 mL via INTRAMUSCULAR
  Filled 2017-10-28: qty 0.5
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
## 2018-03-22 ENCOUNTER — Emergency Department (HOSPITAL_COMMUNITY)
Admission: EM | Admit: 2018-03-22 | Discharge: 2018-03-22 | Payer: Self-pay | Attending: Emergency Medicine | Admitting: Emergency Medicine

## 2018-03-22 ENCOUNTER — Other Ambulatory Visit: Payer: Self-pay

## 2018-03-22 ENCOUNTER — Encounter (HOSPITAL_COMMUNITY): Payer: Self-pay | Admitting: Emergency Medicine

## 2018-03-22 DIAGNOSIS — F17210 Nicotine dependence, cigarettes, uncomplicated: Secondary | ICD-10-CM

## 2018-03-22 DIAGNOSIS — F419 Anxiety disorder, unspecified: Secondary | ICD-10-CM

## 2018-03-22 DIAGNOSIS — R45851 Suicidal ideations: Secondary | ICD-10-CM | POA: Insufficient documentation

## 2018-03-22 DIAGNOSIS — Y905 Blood alcohol level of 100-119 mg/100 ml: Secondary | ICD-10-CM | POA: Insufficient documentation

## 2018-03-22 DIAGNOSIS — Z79899 Other long term (current) drug therapy: Secondary | ICD-10-CM | POA: Insufficient documentation

## 2018-03-22 DIAGNOSIS — Z046 Encounter for general psychiatric examination, requested by authority: Secondary | ICD-10-CM | POA: Insufficient documentation

## 2018-03-22 DIAGNOSIS — R451 Restlessness and agitation: Secondary | ICD-10-CM | POA: Insufficient documentation

## 2018-03-22 DIAGNOSIS — F1910 Other psychoactive substance abuse, uncomplicated: Secondary | ICD-10-CM

## 2018-03-22 DIAGNOSIS — F1014 Alcohol abuse with alcohol-induced mood disorder: Secondary | ICD-10-CM

## 2018-03-22 DIAGNOSIS — F329 Major depressive disorder, single episode, unspecified: Secondary | ICD-10-CM | POA: Insufficient documentation

## 2018-03-22 DIAGNOSIS — R454 Irritability and anger: Secondary | ICD-10-CM | POA: Insufficient documentation

## 2018-03-22 LAB — CBC
HEMATOCRIT: 39 % (ref 36.0–46.0)
Hemoglobin: 13 g/dL (ref 12.0–15.0)
MCH: 29.3 pg (ref 26.0–34.0)
MCHC: 33.3 g/dL (ref 30.0–36.0)
MCV: 87.8 fL (ref 78.0–100.0)
Platelets: 241 10*3/uL (ref 150–400)
RBC: 4.44 MIL/uL (ref 3.87–5.11)
RDW: 12.5 % (ref 11.5–15.5)
WBC: 14.3 10*3/uL — AB (ref 4.0–10.5)

## 2018-03-22 LAB — COMPREHENSIVE METABOLIC PANEL
ALT: 21 U/L (ref 0–44)
AST: 31 U/L (ref 15–41)
Albumin: 4.8 g/dL (ref 3.5–5.0)
Alkaline Phosphatase: 45 U/L (ref 38–126)
Anion gap: 15 (ref 5–15)
BILIRUBIN TOTAL: 1.2 mg/dL (ref 0.3–1.2)
BUN: 26 mg/dL — ABNORMAL HIGH (ref 6–20)
CALCIUM: 9 mg/dL (ref 8.9–10.3)
CO2: 21 mmol/L — ABNORMAL LOW (ref 22–32)
Chloride: 103 mmol/L (ref 98–111)
Creatinine, Ser: 1.2 mg/dL — ABNORMAL HIGH (ref 0.44–1.00)
GFR calc Af Amer: >60 mL/min (ref >60)
GFR, EST NON AFRICAN AMERICAN: 57 mL/min — AB (ref >60)
Glucose, Bld: 119 mg/dL — ABNORMAL HIGH (ref 70–99)
POTASSIUM: 3.1 mmol/L — AB (ref 3.5–5.1)
Sodium: 139 mmol/L (ref 135–145)
Total Protein: 8.1 g/dL (ref 6.5–8.1)

## 2018-03-22 LAB — I-STAT BETA HCG BLOOD, ED (MC, WL, AP ONLY): I-stat hCG, quantitative: <5 m[IU]/mL (ref <5)

## 2018-03-22 LAB — ETHANOL: ALCOHOL ETHYL (B): 114 mg/dL — AB (ref <10)

## 2018-03-22 LAB — SALICYLATE LEVEL

## 2018-03-22 LAB — ACETAMINOPHEN LEVEL

## 2018-03-22 MED ORDER — ACETAMINOPHEN 325 MG PO TABS: 650.0000 mg | ORAL_TABLET | ORAL | Status: DC | PRN

## 2018-03-22 MED ORDER — OLANZAPINE 10 MG PO TBDP: 10.0000 mg | ORAL_TABLET | Freq: Three times a day (TID) | ORAL | Status: DC | PRN

## 2018-03-22 MED ORDER — LORAZEPAM 1 MG PO TABS: 1.0000 mg | ORAL_TABLET | ORAL | Status: DC | PRN

## 2018-03-22 MED ORDER — ONDANSETRON HCL 4 MG PO TABS: 4.0000 mg | ORAL_TABLET | Freq: Three times a day (TID) | ORAL | Status: DC | PRN

## 2018-03-22 MED ORDER — ZIPRASIDONE MESYLATE 20 MG IM SOLR: 20.0000 mg | INTRAMUSCULAR | Status: DC | PRN

## 2018-03-22 NOTE — ED Notes (Addendum)
Pt. In burgundy scrubs. Pt. And belongings searched and wanded by security. Pt. Has 1 pant, 1 black bra,2 rings(one ring broken), cigarettes, 1 lighters, 1 bracelet, 1 pr. Black/red shoes, 1 charger, 1 pink tank top and 1 black bag. Pt belongings locked up in cabinet 16-18 and black bag locked under desk at nurses station in 16-22 side. No cell phone and no wallet.

## 2018-03-22 NOTE — Progress Notes (Signed)
Pt to room with [employer] escort.  Pt is angry, refuses to answer questions other than to say she wants to be left alone.  Pt is irritable , makes no eye contact and answers in 1 word sentences.  Pt is disheveled. Pt has hx of substance abuse, Bi polar and SI. Pt monitored for safety.
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
## 2018-03-22 NOTE — ED Notes (Signed)
Patient refusing vital signs and other treatments. EDP at bedside.

## 2018-03-22 NOTE — ED Notes (Signed)
Patient denies pain and is resting comfortably.  

## 2018-03-22 NOTE — ED Notes (Signed)
Bed: [facility]B Expected date:  Expected time:  Means of arrival:  Comments: [employer]

## 2018-03-22 NOTE — Progress Notes (Signed)
Attempting to conduct telepsych assessment. Cart not ringing when TTS calls. Trying to call nurse to ensure the telepsych cart is on, no answer. Will continue trying to call the cart.  Princess BruinsAquicha Freman Lapage, MSW, LCSW Therapeutic Triage Specialist  [phone]

## 2018-03-22 NOTE — ED Notes (Signed)
[facility] Expected date:  Expected time:  Means of arrival:  Comments: [facility]

## 2018-03-22 NOTE — ED Notes (Signed)
EKG given to EDP,Pollina,MD., for review. 

## 2018-03-22 NOTE — ED Notes (Signed)
Notified [employer], of pt discharge. Dispatcher April noted, "officer will be sent as soon as possible."

## 2018-03-22 NOTE — ED Notes (Addendum)
[facility] [place] [facility]. Unable to get her to sign discharge notation.
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
## 2018-03-22 NOTE — Progress Notes (Signed)
Per Donell SievertSpencer Simon, PA pt meets criteria for inpt treatment. TTS to seek placement. EDP Pollina, Canary Brimhristopher J, MD and pt's nurse Maralyn SagoSarah, RN have been advised.   Princess BruinsAquicha Brandalynn Ofallon, MSW, LCSW Therapeutic Triage Specialist  [phone]

## 2018-03-22 NOTE — ED Notes (Signed)
It is noted when d/c contact [employer], warrant will be issued and taken to jail. Pt continues to sleep.

## 2018-04-08 ENCOUNTER — Emergency Department (HOSPITAL_COMMUNITY): Payer: No Typology Code available for payment source

## 2018-04-08 ENCOUNTER — Other Ambulatory Visit: Payer: Self-pay

## 2018-04-08 ENCOUNTER — Emergency Department (HOSPITAL_COMMUNITY)
Admission: EM | Admit: 2018-04-08 | Discharge: 2018-04-08 | Disposition: A | Discharge status: Home / Self Care | Payer: No Typology Code available for payment source | Attending: Emergency Medicine | Admitting: Emergency Medicine

## 2018-04-08 ENCOUNTER — Encounter (HOSPITAL_COMMUNITY): Payer: Self-pay | Admitting: *Deleted

## 2018-04-08 DIAGNOSIS — S89392A Other physeal fracture of lower end of left fibula, initial encounter for closed fracture: Secondary | ICD-10-CM | POA: Insufficient documentation

## 2018-04-08 DIAGNOSIS — S82892A Other fracture of left lower leg, initial encounter for closed fracture: Secondary | ICD-10-CM

## 2018-04-08 DIAGNOSIS — F1490 Cocaine use, unspecified, uncomplicated: Secondary | ICD-10-CM | POA: Insufficient documentation

## 2018-04-08 DIAGNOSIS — S40211A Abrasion of right shoulder, initial encounter: Secondary | ICD-10-CM | POA: Insufficient documentation

## 2018-04-08 DIAGNOSIS — Y999 Unspecified external cause status: Secondary | ICD-10-CM | POA: Insufficient documentation

## 2018-04-08 DIAGNOSIS — Y939 Activity, unspecified: Secondary | ICD-10-CM | POA: Insufficient documentation

## 2018-04-08 DIAGNOSIS — F1290 Cannabis use, unspecified, uncomplicated: Secondary | ICD-10-CM | POA: Diagnosis not present

## 2018-04-08 DIAGNOSIS — S00502A Unspecified superficial injury of oral cavity, initial encounter: Secondary | ICD-10-CM | POA: Diagnosis not present

## 2018-04-08 DIAGNOSIS — S0081XA Abrasion of other part of head, initial encounter: Secondary | ICD-10-CM | POA: Insufficient documentation

## 2018-04-08 DIAGNOSIS — S50312A Abrasion of left elbow, initial encounter: Secondary | ICD-10-CM | POA: Insufficient documentation

## 2018-04-08 DIAGNOSIS — Y92410 Unspecified street and highway as the place of occurrence of the external cause: Secondary | ICD-10-CM | POA: Diagnosis not present

## 2018-04-08 DIAGNOSIS — S0993XA Unspecified injury of face, initial encounter: Secondary | ICD-10-CM

## 2018-04-08 DIAGNOSIS — S80212A Abrasion, left knee, initial encounter: Secondary | ICD-10-CM | POA: Diagnosis not present

## 2018-04-08 DIAGNOSIS — T07XXXA Unspecified multiple injuries, initial encounter: Secondary | ICD-10-CM | POA: Diagnosis present

## 2018-04-08 DIAGNOSIS — S62636A Displaced fracture of distal phalanx of right little finger, initial encounter for closed fracture: Secondary | ICD-10-CM | POA: Diagnosis not present

## 2018-04-08 DIAGNOSIS — F17210 Nicotine dependence, cigarettes, uncomplicated: Secondary | ICD-10-CM | POA: Insufficient documentation

## 2018-04-08 MED ORDER — IBUPROFEN 200 MG PO TABS
400.0000 mg | ORAL_TABLET | Freq: Once | ORAL | Status: AC | PRN
  Administered 2018-04-08: 400 mg via ORAL
  Filled 2018-04-08: qty 2

## 2018-04-08 MED ORDER — OXYCODONE-ACETAMINOPHEN 5-325 MG PO TABS
2.0000 | ORAL_TABLET | Freq: Once | ORAL | Status: AC
  Administered 2018-04-08: 2 via ORAL
  Filled 2018-04-08: qty 2

## 2018-04-08 MED ORDER — IBUPROFEN 800 MG PO TABS: 800.0000 mg | ORAL_TABLET | Freq: Three times a day (TID) | ORAL | 0 refills | Status: AC | PRN

## 2018-04-08 MED ORDER — BACITRACIN ZINC 500 UNIT/GM EX OINT
TOPICAL_OINTMENT | Freq: Two times a day (BID) | CUTANEOUS | Status: DC
  Administered 2018-04-08: 6 via TOPICAL
  Filled 2018-04-08: qty 5.4

## 2018-04-08 NOTE — ED Notes (Signed)
ED Provider at bedside. 

## 2018-04-08 NOTE — ED Notes (Signed)
Patient transported to X-ray 
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
## 2018-04-09 NOTE — ED Notes (Signed)
Pt was given a set of crutches on her way out of the ED

## 2018-12-28 ENCOUNTER — Encounter (HOSPITAL_COMMUNITY): Payer: Self-pay

## 2018-12-28 ENCOUNTER — Other Ambulatory Visit: Payer: Self-pay

## 2018-12-28 ENCOUNTER — Emergency Department (HOSPITAL_COMMUNITY)
Admission: EM | Admit: 2018-12-28 | Discharge: 2018-12-30 | Disposition: A | Discharge status: Home / Self Care | Payer: Self-pay | Attending: Emergency Medicine | Admitting: Emergency Medicine

## 2018-12-28 DIAGNOSIS — F17210 Nicotine dependence, cigarettes, uncomplicated: Secondary | ICD-10-CM | POA: Insufficient documentation

## 2018-12-28 DIAGNOSIS — R456 Violent behavior: Secondary | ICD-10-CM | POA: Insufficient documentation

## 2018-12-28 DIAGNOSIS — F1910 Other psychoactive substance abuse, uncomplicated: Secondary | ICD-10-CM | POA: Insufficient documentation

## 2018-12-28 DIAGNOSIS — R4689 Other symptoms and signs involving appearance and behavior: Secondary | ICD-10-CM
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
## 2018-12-28 NOTE — ED Notes (Signed)
Patient yelling, stating "I need to pee. I am hungry. Hungry hungry hippos." Patient making strange noises and yelling at staff members, stating that she is "having a moment."

## 2018-12-28 NOTE — ED Notes (Signed)
Bed: XF07 Expected date:  Expected time:  Means of arrival:  Comments: 32F psych

## 2018-12-29 ENCOUNTER — Encounter (HOSPITAL_COMMUNITY): Payer: Self-pay | Admitting: Registered Nurse

## 2018-12-29 LAB — CBC WITH DIFFERENTIAL/PLATELET
Abs Immature Granulocytes: 0.02 10*3/uL (ref 0.00–0.07)
Basophils Absolute: 0 10*3/uL (ref 0.0–0.1)
Basophils Relative: 1 %
Eosinophils Absolute: 0.1 10*3/uL (ref 0.0–0.5)
Eosinophils Relative: 1 %
HCT: 38.6 % (ref 36.0–46.0)
Hemoglobin: 12.3 g/dL (ref 12.0–15.0)
Immature Granulocytes: 0 %
Lymphocytes Relative: 37 %
Lymphs Abs: 3.3 10*3/uL (ref 0.7–4.0)
MCH: 29.7 pg (ref 26.0–34.0)
MCHC: 31.9 g/dL (ref 30.0–36.0)
MCV: 93.2 fL (ref 80.0–100.0)
Monocytes Absolute: 0.7 10*3/uL (ref 0.1–1.0)
Monocytes Relative: 8 %
Neutro Abs: 4.7 10*3/uL (ref 1.7–7.7)
Neutrophils Relative %: 53 %
Platelets: 220 10*3/uL (ref 150–400)
RBC: 4.14 MIL/uL (ref 3.87–5.11)
RDW: 13.6 % (ref 11.5–15.5)
WBC: 8.8 10*3/uL (ref 4.0–10.5)
nRBC: 0 % (ref 0.0–0.2)

## 2018-12-29 LAB — COMPREHENSIVE METABOLIC PANEL
ALT: 22 U/L (ref 0–44)
AST: 31 U/L (ref 15–41)
Albumin: 4.1 g/dL (ref 3.5–5.0)
Alkaline Phosphatase: 56 U/L (ref 38–126)
Anion gap: 12 (ref 5–15)
BUN: 18 mg/dL (ref 6–20)
CO2: 20 mmol/L — ABNORMAL LOW (ref 22–32)
Calcium: 8.6 mg/dL — ABNORMAL LOW (ref 8.9–10.3)
Chloride: 108 mmol/L (ref 98–111)
Creatinine, Ser: 0.88 mg/dL (ref 0.44–1.00)
GFR calc Af Amer: >60 mL/min (ref >60)
GFR calc non Af Amer: >60 mL/min (ref >60)
Glucose, Bld: 88 mg/dL (ref 70–99)
Potassium: 3.3 mmol/L — ABNORMAL LOW (ref 3.5–5.1)
Sodium: 140 mmol/L (ref 135–145)
Total Bilirubin: 0.4 mg/dL (ref 0.3–1.2)
Total Protein: 6.8 g/dL (ref 6.5–8.1)

## 2018-12-29 LAB — ETHANOL: Alcohol, Ethyl (B): 82 mg/dL — ABNORMAL HIGH (ref <10)

## 2018-12-29 LAB — LITHIUM LEVEL: Lithium Lvl: 0.15 mmol/L — ABNORMAL LOW (ref 0.60–1.20)

## 2018-12-29 LAB — RAPID URINE DRUG SCREEN, HOSP PERFORMED
Amphetamines: POSITIVE — AB
Barbiturates: NOT DETECTED
Benzodiazepines: POSITIVE — AB
Cocaine: NOT DETECTED
Opiates: NOT DETECTED
Tetrahydrocannabinol: POSITIVE — AB

## 2018-12-29 MED ORDER — OLANZAPINE 2.5 MG PO TABS: 2.5000 mg | ORAL_TABLET | Freq: Two times a day (BID) | ORAL | Status: DC | PRN

## 2018-12-29 MED ORDER — OLANZAPINE 10 MG PO TBDP
10.0000 mg | ORAL_TABLET | ORAL | Status: AC
  Administered 2018-12-29: 10 mg via ORAL
  Filled 2018-12-29: qty 1

## 2018-12-29 MED ORDER — TRAZODONE HCL 100 MG PO TABS: 100.0000 mg | ORAL_TABLET | Freq: Every evening | ORAL | Status: DC | PRN

## 2018-12-29 MED ORDER — ARIPIPRAZOLE 5 MG PO TABS
5.0000 mg | ORAL_TABLET | Freq: Every day | ORAL | Status: DC
  Administered 2018-12-29 – 2018-12-30 (×2): 5 mg via ORAL
  Filled 2018-12-29 (×2): qty 1

## 2018-12-29 MED ORDER — LITHIUM CARBONATE ER 300 MG PO TBCR
300.0000 mg | EXTENDED_RELEASE_TABLET | Freq: Two times a day (BID) | ORAL | Status: DC
  Administered 2018-12-29 – 2018-12-30 (×3): 300 mg via ORAL
  Filled 2018-12-29 (×3): qty 1

## 2018-12-29 MED ORDER — LORAZEPAM 1 MG PO TABS: 1.0000 mg | ORAL_TABLET | Freq: Four times a day (QID) | ORAL | Status: DC | PRN

## 2018-12-29 NOTE — ED Notes (Signed)
Pt woken to await TTS consult.

## 2018-12-29 NOTE — ED Notes (Signed)
Pt sleeping at this time.

## 2018-12-29 NOTE — ED Notes (Addendum)
Pt refused mesh panties and pad. Pt dressed out in maroon scrubs and belongings were placed in 23-25 cabinet.

## 2018-12-29 NOTE — ED Notes (Signed)
Pt asleep at this time

## 2018-12-29 NOTE — ED Notes (Signed)
Pt sleeping at this time, will administer medications and draw labs when patient wakes.  Will continue to monitor patient.
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
## 2018-12-29 NOTE — ED Notes (Addendum)
RN informed that Pt walked out to nurses station and took phone back into room.  This RN went in to check on pt and inform her this is her one phone call after 9 am. Pt informed visitors are not allowed in the ED.  Pt is eating meal tray at this time.  At this time pt is calm and cooperative, and talking to her husband on the phone.
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
## 2018-12-29 NOTE — ED Notes (Signed)
Per MD, restraints removed as patient agreed to good behavior (no verbal or physical abuse towards staff). Provided patient with sandwich and drink. Will continue to monitor patient.
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
## 2018-12-29 NOTE — ED Provider Notes (Signed)
[facility]-EMERGENCY DEPT Provider Note   CSN: [id number] Arrival date & time: 12/28/18  2312    History   Chief Complaint Chief Complaint  Patient presents with  . Altered Mental Status    HPI Sara Vazquez is a 38 y.o. female.     Patient presents to the emergency department by ambulance for altered mental status.  Was extremely agitated and combative prior to coming to the ER.  Patient required IM Versed to calm her down but was still yelling, kicking and screaming at arrival to the ER.  Patient reports that she has been off of her bipolar medications for an unknown period of time and has been "self-medicating" with methamphetamine.  She is also distraught over her relationship with her boyfriend.     Past Medical History:  Diagnosis Date  . Kidney stones     Patient Active Problem List   Diagnosis Date Noted  . Alcohol abuse with alcohol-induced mood disorder (HCC) 03/22/2018  . Polysubstance abuse (HCC) 06/13/2017    Past Surgical History:  Procedure Laterality Date  . ABDOMINAL SURGERY    . TONSILLECTOMY       OB History    Gravida  5   Para  5   Term  4   Preterm  1   AB      Living  4     SAB      TAB      Ectopic      Multiple      Live Births               Home Medications    Prior to Admission medications   Medication Sig Start Date End Date Taking? Authorizing Provider  ibuprofen (ADVIL,MOTRIN) 800 MG tablet Take 1 tablet (800 mg total) by mouth every 8 (eight) hours as needed. Patient not taking: Reported on 12/29/2018 04/08/18   Terrilee FilesButler, Michael C, MD    Family History History reviewed. No pertinent family history.  Social History Social History   Tobacco Use  . Smoking status: Current Every Day Smoker    Types: Cigarettes  . Smokeless tobacco: Never Used  Substance Use Topics  . Alcohol use: Yes    Comment: occasional  . Drug use: Yes    Types: Marijuana, Cocaine,  Methamphetamines     Allergies   Shellfish allergy and Sulfa antibiotics   Review of Systems Review of Systems  Psychiatric/Behavioral: Positive for agitation, behavioral problems, dysphoric mood and sleep disturbance. The patient is nervous/anxious.   All other systems reviewed and are negative.    Physical Exam Updated Vital Signs BP 108/70 (BP Location: Right Arm)   Pulse 63   Temp 98.7 F (37.1 C) (Oral)   Resp (!) 29   Ht 5\' 9"  (1.753 m)   Wt 59 kg   SpO2 99%   BMI 19.20 kg/m   Physical Exam Vitals signs and nursing note reviewed.  Constitutional:      General: She is not in acute distress.    Appearance: Normal appearance. She is well-developed.  HENT:     Head: Normocephalic and atraumatic.     Right Ear: Hearing normal.     Left Ear: Hearing normal.     Nose: Nose normal.  Eyes:     Conjunctiva/sclera: Conjunctivae normal.     Pupils: Pupils are equal, round, and reactive to light.  Neck:     Musculoskeletal: Normal range of motion and neck supple.  Cardiovascular:  Rate and Rhythm: Regular rhythm.     Heart sounds: S1 normal and S2 normal. No murmur. No friction rub. No gallop.   Pulmonary:     Effort: Pulmonary effort is normal. No respiratory distress.     Breath sounds: Normal breath sounds.  Chest:     Chest wall: No tenderness.  Abdominal:     General: Bowel sounds are normal.     Palpations: Abdomen is soft.     Tenderness: There is no abdominal tenderness. There is no guarding or rebound. Negative signs include Murphy's sign and McBurney's sign.     Hernia: No hernia is present.  Musculoskeletal: Normal range of motion.  Skin:    General: Skin is warm and dry.     Findings: No rash.  Neurological:     Mental Status: She is alert and oriented to person, place, and time.     GCS: GCS eye subscore is 4. GCS verbal subscore is 5. GCS motor subscore is 6.     Cranial Nerves: No cranial nerve deficit.     Sensory: No sensory deficit.      Coordination: Coordination normal.  Psychiatric:        Speech: Speech is rapid and pressured.        Behavior: Behavior is agitated and aggressive.        Thought Content: Thought content does not include suicidal ideation. Thought content does not include suicidal plan.      ED Treatments / Results  Labs (all labs ordered are listed, but only abnormal results are displayed) Labs Reviewed  COMPREHENSIVE METABOLIC PANEL - Abnormal; Notable for the following components:      Result Value   Potassium 3.3 (*)    CO2 20 (*)    Calcium 8.6 (*)    All other components within normal limits  ETHANOL - Abnormal; Notable for the following components:   Alcohol, Ethyl (B) 82 (*)    All other components within normal limits  CBC WITH DIFFERENTIAL/PLATELET  RAPID URINE DRUG SCREEN, HOSP PERFORMED    EKG EKG Interpretation  Date/Time:  Tuesday December 28 2018 23:30:58 EDT Ventricular Rate:  77 PR Interval:    QRS Duration: 97 QT Interval:  420 QTC Calculation: 476 R Axis:   63 Text Interpretation:  Sinus rhythm Borderline short PR interval Left atrial enlargement Minimal ST depression, inferior leads No significant change since last tracing Confirmed by Gilda CreasePollina, Devere Brem J [phone]([id number]) on 12/29/2018 5:08:07 AM   Radiology No results found.  Procedures Procedures (including critical care time)  Medications Ordered in ED Medications  OLANZapine zydis (ZYPREXA) disintegrating tablet 10 mg (10 mg Oral Given 12/29/18 0455)     Initial Impression / Assessment and Plan / ED Course  I have reviewed the triage vital signs and the nursing notes.  Pertinent labs & imaging results that were available during my care of the patient were reviewed by me and considered in my medical decision making (see chart for details).        Patient brought to the ER by EMS for evaluation.  She has been combative during transport.  She required Versed to get her to stop striking the medics.  She was very  agitated at arrival.  She would intermittently be able to hold a conversation and then would become extremely violent, striking out at staff and hitting herself in the head.  Patient has a history of mental disorder including bipolar with no current treatment plan.  She is instead  using methamphetamine because she states that she "knows enough about drugs" to treat herself.  As the patient escalated here in the ER became more aggressive towards staff, IVC paperwork was initiated.  Final Clinical Impressions(s) / ED Diagnoses   Final diagnoses:  Aggressive behavior  Polysubstance abuse [facility])    ED Discharge Orders    None       Orpah Greek, MD 12/29/18 [phone]

## 2018-12-29 NOTE — Consult Note (Signed)
  This provider and Dr. Mallie Darting attempted to see Sara Vazquez 38 y.o. female face to face but unable to wake.  TTS to assess when patient is awake.  Will restart patient psychotropic medications   Disposition:  Pending TTS assessment and medical clearance   Destane Speas B. Yovanni Frenette, NP

## 2018-12-29 NOTE — ED Notes (Signed)
Patient made aware that urine sample is needed.  

## 2018-12-30 DIAGNOSIS — F1910 Other psychoactive substance abuse, uncomplicated: Secondary | ICD-10-CM

## 2018-12-30 DIAGNOSIS — R4689 Other symptoms and signs involving appearance and behavior: Secondary | ICD-10-CM

## 2018-12-30 NOTE — ED Notes (Signed)
So far this shift, pt. Has been sleeping soundly. She aroused easily for v.s. and requested no breakfast, rather she would prefer to keep sleeping, which we allow her to do. Her skin is normal, warm and dry.

## 2018-12-30 NOTE — ED Notes (Signed)
She has eaten her breakfast and has taken her scheduled meds, for which she thanks me. She is also issued a set of blue scrubs and sandals.

## 2018-12-30 NOTE — Discharge Instructions (Addendum)
You have a serious problem with drug use. It is going to have a negative impact on your life and the lives of the people around you until you get sober.   For your behavioral health needs, you are advised to follow up with [facility].  They offer substance abuse counseling.  Call them at your earliest opportunity to schedule an intake appointment:       [facility]      [place], [place] [address]      [phone]

## 2018-12-30 NOTE — BH Assessment (Signed)
[place] Assessment Progress Note  Per Myles Lipps, MD, this pt does not require psychiatric hospitalization at this time.  Pt presents under IVC initiated by EDP Joseph Berkshire, MD which Dr Mallie Darting has rescinded.  Pt is to be discharged from [facility] with recommendation to follow up with [facility].  This has been included in pt's discharge instructions.  Pt's nurse has been notified.  Jalene Mullet, [place] Triage Specialist [phone]

## 2019-08-11 ENCOUNTER — Emergency Department (HOSPITAL_COMMUNITY)
Admission: EM | Admit: 2019-08-11 | Discharge: 2019-08-13 | Disposition: A | Discharge status: Home / Self Care | Payer: Self-pay | Attending: Emergency Medicine | Admitting: Emergency Medicine

## 2019-08-11 DIAGNOSIS — Z20822 Contact with and (suspected) exposure to covid-19: Secondary | ICD-10-CM | POA: Insufficient documentation

## 2019-08-11 DIAGNOSIS — F1910 Other psychoactive substance abuse, uncomplicated: Secondary | ICD-10-CM | POA: Insufficient documentation

## 2019-08-11 DIAGNOSIS — F17210 Nicotine dependence, cigarettes, uncomplicated: Secondary | ICD-10-CM | POA: Insufficient documentation

## 2019-08-11 DIAGNOSIS — F29 Unspecified psychosis not due to a substance or known physiological condition: Secondary | ICD-10-CM | POA: Insufficient documentation

## 2019-08-11 DIAGNOSIS — F250 Schizoaffective disorder, bipolar type: Secondary | ICD-10-CM | POA: Insufficient documentation

## 2019-08-12 LAB — CBC WITH DIFFERENTIAL/PLATELET
Abs Immature Granulocytes: 0.04 10*3/uL (ref 0.00–0.07)
Basophils Absolute: 0 10*3/uL (ref 0.0–0.1)
Basophils Relative: 0 %
Eosinophils Absolute: 0 10*3/uL (ref 0.0–0.5)
Eosinophils Relative: 0 %
HCT: 44.2 % (ref 36.0–46.0)
Hemoglobin: 14.1 g/dL (ref 12.0–15.0)
Immature Granulocytes: 0 %
Lymphocytes Relative: 15 %
Lymphs Abs: 2 10*3/uL (ref 0.7–4.0)
MCH: 30.4 pg (ref 26.0–34.0)
MCHC: 31.9 g/dL (ref 30.0–36.0)
MCV: 95.3 fL (ref 80.0–100.0)
Monocytes Absolute: 0.7 10*3/uL (ref 0.1–1.0)
Monocytes Relative: 5 %
Neutro Abs: 10.5 10*3/uL — ABNORMAL HIGH (ref 1.7–7.7)
Neutrophils Relative %: 80 %
Platelets: 292 10*3/uL (ref 150–400)
RBC: 4.64 MIL/uL (ref 3.87–5.11)
RDW: 12.5 % (ref 11.5–15.5)
WBC: 13.3 10*3/uL — ABNORMAL HIGH (ref 4.0–10.5)
nRBC: 0 % (ref 0.0–0.2)

## 2019-08-12 LAB — COMPREHENSIVE METABOLIC PANEL
ALT: 23 U/L (ref 0–44)
AST: 35 U/L (ref 15–41)
Albumin: 4.3 g/dL (ref 3.5–5.0)
Alkaline Phosphatase: 63 U/L (ref 38–126)
Anion gap: 14 (ref 5–15)
BUN: 21 mg/dL — ABNORMAL HIGH (ref 6–20)
CO2: 22 mmol/L (ref 22–32)
Calcium: 9.7 mg/dL (ref 8.9–10.3)
Chloride: 104 mmol/L (ref 98–111)
Creatinine, Ser: 1.25 mg/dL — ABNORMAL HIGH (ref 0.44–1.00)
GFR calc Af Amer: >60 mL/min (ref >60)
GFR calc non Af Amer: 54 mL/min — ABNORMAL LOW (ref >60)
Glucose, Bld: 96 mg/dL (ref 70–99)
Potassium: 4.3 mmol/L (ref 3.5–5.1)
Sodium: 140 mmol/L (ref 135–145)
Total Bilirubin: 1.3 mg/dL — ABNORMAL HIGH (ref 0.3–1.2)
Total Protein: 7 g/dL (ref 6.5–8.1)

## 2019-08-12 LAB — ETHANOL: Alcohol, Ethyl (B): <10 mg/dL (ref <10)

## 2019-08-12 LAB — RESPIRATORY PANEL BY RT PCR (FLU A&B, COVID)
Influenza A by PCR: NEGATIVE
Influenza B by PCR: NEGATIVE
SARS Coronavirus 2 by RT PCR: NEGATIVE

## 2019-08-12 LAB — I-STAT BETA HCG BLOOD, ED (MC, WL, AP ONLY): I-stat hCG, quantitative: <5 m[IU]/mL (ref <5)

## 2019-08-12 MED ORDER — ZIPRASIDONE MESYLATE 20 MG IM SOLR
20.0000 mg | Freq: Once | INTRAMUSCULAR | Status: AC
  Administered 2019-08-12: 20 mg via INTRAMUSCULAR
  Filled 2019-08-12: qty 20

## 2019-08-12 NOTE — ED Notes (Signed)
EKG leads removed from pt due to flight risk. Warm blanket given and resting.

## 2019-08-12 NOTE — ED Triage Notes (Signed)
Pt arrived via [employer].   Pt uncooperative, not answering questions or providing story. Pt states she is feeling "alone and lost."   Per [employer] pt was "talking to someone who wasn't there" upon their arrival.

## 2019-08-12 NOTE — ED Notes (Signed)
Breakfast ordered 

## 2019-08-12 NOTE — ED Notes (Signed)
Faxed IVC papers to [facility]

## 2019-08-12 NOTE — ED Notes (Signed)
Informed by Jesus NT that pt was no longer in room. EDP, Security, [employer] aware pt is gone and is IVC.

## 2019-08-12 NOTE — ED Notes (Signed)
Lunch Tray Ordered @ 1032. 

## 2019-08-12 NOTE — ED Provider Notes (Signed)
[facility] Emergency Department Provider Note MRN:  [id number]  Arrival date & time: 08/12/19     Chief Complaint   Depression (IVC)   History of Present Illness   Sara Vazquez is a 38 y.o. year-old female with a history of bipolar/schizophrenia presenting to the ED with chief complaint of IVC.  Patient was found naked and wandering, IVC'ed, not taking medications reportedly.  Patient is psychotic and unable to answer questions.  I was unable to obtain an accurate HPI, PMH, or ROS due to the patient's psychosis.  Level 5 caveat.  Review of Systems  Positive for bizarre behavior, psychosis.  Patient's Health History    Past Medical History:  Diagnosis Date  . Kidney stones     Past Surgical History:  Procedure Laterality Date  . ABDOMINAL SURGERY    . TONSILLECTOMY      No family history on file.  Social History   Socioeconomic History  . Marital status: Single    Spouse name: Not on file  . Number of children: Not on file  . Years of education: Not on file  . Highest education level: Not on file  Occupational History  . Not on file  Tobacco Use  . Smoking status: Current Every Day Smoker    Types: Cigarettes  . Smokeless tobacco: Never Used  Substance and Sexual Activity  . Alcohol use: Yes    Comment: occasional  . Drug use: Yes    Types: Marijuana, Cocaine, Methamphetamines  . Sexual activity: Yes    Birth control/protection: Surgical  Other Topics Concern  . Not on file  Social History Narrative  . Not on file   Social Determinants of Health   Financial Resource Strain:   . Difficulty of Paying Living Expenses: Not on file  Food Insecurity:   . Worried About Charity fundraiser in the Last Year: Not on file  . Ran Out of Food in the Last Year: Not on file  Transportation Needs:   . Lack of Transportation (Medical): Not on file  . Lack of Transportation (Non-Medical): Not on file  Physical Activity:   .  Days of Exercise per Week: Not on file  . Minutes of Exercise per Session: Not on file  Stress:   . Feeling of Stress : Not on file  Social Connections:   . Frequency of Communication with Friends and Family: Not on file  . Frequency of Social Gatherings with Friends and Family: Not on file  . Attends Religious Services: Not on file  . Active Member of Clubs or Organizations: Not on file  . Attends Archivist Meetings: Not on file  . Marital Status: Not on file  Intimate Partner Violence:   . Fear of Current or Ex-Partner: Not on file  . Emotionally Abused: Not on file  . Physically Abused: Not on file  . Sexually Abused: Not on file     Physical Exam   Vitals:   08/12/19 0413 08/12/19 0415  BP: (!) 142/85 133/85  Pulse: 88 99  Resp: 16 18  SpO2: 98% 100%    CONSTITUTIONAL: Chronically ill-appearing, NAD NEURO: Nonparticipatory, moving all extremities, performing complex tasks, unwilling to follow commands or answer questions. EYES:  eyes equal and reactive ENT/NECK:  no LAD, no JVD CARDIO: Regular rate, well-perfused, normal S1 and S2 PULM:  CTAB no wheezing or rhonchi GI/GU:  normal bowel sounds, non-distended, non-tender MSK/SPINE:  No gross deformities, no edema SKIN:  no rash,  atraumatic PSYCH: Bizarre and withdrawn  *Additional and/or pertinent findings included in MDM below  Diagnostic and Interventional Summary    EKG Interpretation  Date/Time:  Friday August 12 2019 03:39:19 EST Ventricular Rate:  74 PR Interval:    QRS Duration: 93 QT Interval:  397 QTC Calculation: 441 R Axis:   64 Text Interpretation: Sinus rhythm Short PR interval Biatrial enlargement RSR' in V1 or V2, probably normal variant No significant change was found Confirmed by Kennis Carina [phone]) on 08/12/2019 4:40:37 AM      Cardiac Monitoring Interpretation:  Labs Reviewed  COMPREHENSIVE METABOLIC PANEL - Abnormal; Notable for the following components:      Result Value     BUN 21 (*)    Creatinine, Ser 1.25 (*)    Total Bilirubin 1.3 (*)    GFR calc non Af Amer 54 (*)    All other components within normal limits  CBC WITH DIFFERENTIAL/PLATELET - Abnormal; Notable for the following components:   WBC 13.3 (*)    Neutro Abs 10.5 (*)    All other components within normal limits  RESPIRATORY PANEL BY RT PCR (FLU A&B, COVID)  ETHANOL  RAPID URINE DRUG SCREEN, HOSP PERFORMED  I-STAT BETA HCG BLOOD, ED ([facility], [facility], [facility] ONLY)    No orders to display    Medications  ziprasidone (GEODON) injection 20 mg (20 mg Intramuscular Given 08/12/19 0444)     Procedures  /  Critical Care .Critical Care Performed by: Sabas Sous, MD Authorized by: Sabas Sous, MD   Critical care provider statement:    Critical care time (minutes):  32   Critical care was necessary to treat or prevent imminent or life-threatening deterioration of the following conditions: Acute psychosis.   Critical care was time spent personally by me on the following activities:  Discussions with consultants, evaluation of patient's response to treatment, examination of patient, ordering and performing treatments and interventions, ordering and review of laboratory studies, ordering and review of radiographic studies, pulse oximetry, re-evaluation of patient's condition, obtaining history from patient or surrogate and review of old charts    ED Course and Medical Decision Making  I have reviewed the triage vital signs, the nursing notes, and pertinent available records from the EMR.  Pertinent labs & imaging results that were available during my care of the patient were reviewed by me and considered in my medical decision making (see below for details).     Concern for decompensated bipolar disorder or schizophrenia with acute psychosis.  3 AM update: Patient unfortunately eloped from the emergency department.  The police were immediately made aware and she was found outside of her home very  close to the hospital.  She is now back in the emergency department under very close observation, IVC still in place, she is combative, requiring chemical and physical restraints.  Patient is medically cleared, awaiting TTS recommendations.  Signed out to default provider at shift change.  Elmer Sow. Pilar Plate, MD [facility] Emergency Medicine [facility] [email or web address]  Final Clinical Impressions(s) / ED Diagnoses     ICD-10-CM   1. Psychosis, unspecified psychosis type (HCC)  F29     ED Discharge Orders    None       Discharge Instructions Discussed with and Provided to Patient:   Discharge Instructions   None       Sabas Sous, MD 08/12/19 [phone]

## 2019-08-12 NOTE — ED Notes (Signed)
4-point violent restraints applied to pt wrist and ankles. Pt coroperative with application of restraints. [employer] informed pt of behavior and if good behavior, restraints will be removed.

## 2019-08-12 NOTE — ED Notes (Signed)
Pt not found in room, bathroom, or anywhere in the department. Was not seen by any staff.

## 2019-08-12 NOTE — ED Notes (Signed)
TTS in procress

## 2019-08-12 NOTE — ED Notes (Signed)
Pt in bathroom, provided with urine cup

## 2019-08-12 NOTE — ED Notes (Signed)
Dinner Tray Ordered @ 1720 

## 2019-08-12 NOTE — ED Notes (Signed)
Pt taking a shower 

## 2019-08-13 ENCOUNTER — Other Ambulatory Visit: Payer: Self-pay

## 2019-08-13 LAB — RAPID URINE DRUG SCREEN, HOSP PERFORMED
Amphetamines: POSITIVE — AB
Barbiturates: NOT DETECTED
Benzodiazepines: NOT DETECTED
Cocaine: NOT DETECTED
Opiates: NOT DETECTED
Tetrahydrocannabinol: POSITIVE — AB

## 2019-08-13 NOTE — ED Notes (Signed)
Telepsych being performed. 

## 2019-08-13 NOTE — ED Notes (Signed)
Pt now awake - ambulated to bathroom and back to room - eating breakfast. Denies SI/HI. Aware waiting for Telepsych.

## 2019-08-13 NOTE — ED Notes (Addendum)
IVC papers rescinded by Dr Charm Barges - Copy faxed to [facility] - Copy sent to Medical Records - Original placed in folder for [employer]. Pt attempted to reach family member/friend via phone at nurses' desk - unsuccessful.
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
## 2019-08-13 NOTE — ED Notes (Signed)
ALL belongings - 1 labeled belongings bag and 1 valuables envelope - returned to pt - Pt signed verifying all items present. Pt requesting to take pillow - advised unable to do so. Pt requesting to take burgundy shirt - pt given blue paper scrub top.

## 2019-08-13 NOTE — ED Notes (Signed)
Breakfast ordered 
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
## 2019-08-13 NOTE — Discharge Instructions (Signed)
Please follow-up as directed by behavioral health.

## 2019-10-01 ENCOUNTER — Emergency Department (HOSPITAL_COMMUNITY)
Admission: EM | Admit: 2019-10-01 | Discharge: 2019-10-02 | Disposition: A | Discharge status: Home / Self Care | Payer: Medicaid Other | Attending: Emergency Medicine | Admitting: Emergency Medicine

## 2019-10-01 DIAGNOSIS — Z20822 Contact with and (suspected) exposure to covid-19: Secondary | ICD-10-CM | POA: Insufficient documentation

## 2019-10-01 DIAGNOSIS — F1410 Cocaine abuse, uncomplicated: Secondary | ICD-10-CM | POA: Insufficient documentation

## 2019-10-01 DIAGNOSIS — F1210 Cannabis abuse, uncomplicated: Secondary | ICD-10-CM | POA: Insufficient documentation

## 2019-10-01 DIAGNOSIS — F1510 Other stimulant abuse, uncomplicated: Secondary | ICD-10-CM | POA: Insufficient documentation

## 2019-10-01 DIAGNOSIS — F23 Brief psychotic disorder: Secondary | ICD-10-CM

## 2019-10-01 DIAGNOSIS — F17210 Nicotine dependence, cigarettes, uncomplicated: Secondary | ICD-10-CM | POA: Insufficient documentation

## 2019-10-01 DIAGNOSIS — F1910 Other psychoactive substance abuse, uncomplicated: Secondary | ICD-10-CM

## 2019-10-01 LAB — COMPREHENSIVE METABOLIC PANEL
ALT: 44 U/L (ref 0–44)
AST: 60 U/L — ABNORMAL HIGH (ref 15–41)
Albumin: 4.2 g/dL (ref 3.5–5.0)
Alkaline Phosphatase: 63 U/L (ref 38–126)
Anion gap: 13 (ref 5–15)
BUN: 21 mg/dL — ABNORMAL HIGH (ref 6–20)
CO2: 24 mmol/L (ref 22–32)
Calcium: 9.5 mg/dL (ref 8.9–10.3)
Chloride: 104 mmol/L (ref 98–111)
Creatinine, Ser: 1.05 mg/dL — ABNORMAL HIGH (ref 0.44–1.00)
GFR calc Af Amer: >60 mL/min (ref >60)
GFR calc non Af Amer: >60 mL/min (ref >60)
Glucose, Bld: 109 mg/dL — ABNORMAL HIGH (ref 70–99)
Potassium: 4.9 mmol/L (ref 3.5–5.1)
Sodium: 141 mmol/L (ref 135–145)
Total Bilirubin: 0.7 mg/dL (ref 0.3–1.2)
Total Protein: 6.8 g/dL (ref 6.5–8.1)

## 2019-10-01 LAB — CBC WITH DIFFERENTIAL/PLATELET
Abs Immature Granulocytes: 0.03 10*3/uL (ref 0.00–0.07)
Basophils Absolute: 0.1 10*3/uL (ref 0.0–0.1)
Basophils Relative: 1 %
Eosinophils Absolute: 0.1 10*3/uL (ref 0.0–0.5)
Eosinophils Relative: 1 %
HCT: 41.7 % (ref 36.0–46.0)
Hemoglobin: 13.1 g/dL (ref 12.0–15.0)
Immature Granulocytes: 0 %
Lymphocytes Relative: 33 %
Lymphs Abs: 3.7 10*3/uL (ref 0.7–4.0)
MCH: 30 pg (ref 26.0–34.0)
MCHC: 31.4 g/dL (ref 30.0–36.0)
MCV: 95.4 fL (ref 80.0–100.0)
Monocytes Absolute: 0.9 10*3/uL (ref 0.1–1.0)
Monocytes Relative: 8 %
Neutro Abs: 6.3 10*3/uL (ref 1.7–7.7)
Neutrophils Relative %: 57 %
Platelets: 283 10*3/uL (ref 150–400)
RBC: 4.37 MIL/uL (ref 3.87–5.11)
RDW: 12.3 % (ref 11.5–15.5)
WBC: 11.2 10*3/uL — ABNORMAL HIGH (ref 4.0–10.5)
nRBC: 0 % (ref 0.0–0.2)

## 2019-10-01 LAB — I-STAT BETA HCG BLOOD, ED (MC, WL, AP ONLY): I-stat hCG, quantitative: <5 m[IU]/mL (ref <5)

## 2019-10-01 MED ORDER — LORAZEPAM 2 MG/ML IJ SOLN
INTRAMUSCULAR | Status: AC
  Administered 2019-10-01: 22:00:00 2 mg via INTRAMUSCULAR
  Filled 2019-10-01: qty 1

## 2019-10-01 MED ORDER — LORAZEPAM 2 MG/ML IJ SOLN
INTRAMUSCULAR | Status: AC
  Administered 2019-10-01: 2 mg
  Filled 2019-10-01: qty 1

## 2019-10-01 MED ORDER — LORAZEPAM 2 MG/ML IJ SOLN: 2.0000 mg | Freq: Once | INTRAMUSCULAR | Status: DC

## 2019-10-01 NOTE — ED Triage Notes (Signed)
Pt BIB [facility] & [employer]. Hx polysubstance abuse, [employer] called for trespassing, was banned from place of pick up in Feb. Pt in restraints upon arrival, MD Nanavati at bedside upon arrival. This RN gave 2mg  Ativan in L thigh, [employer] & EMS utilized to further restrain pt. VSS en route Unable to obtain hx due to pt being uncooperative

## 2019-10-01 NOTE — ED Notes (Signed)
[employer] officer Marylou Flesher requests that [employer] be notified when pt's IVC is up due to outstanding warrants.

## 2019-10-02 LAB — RESPIRATORY PANEL BY RT PCR (FLU A&B, COVID)
Influenza A by PCR: NEGATIVE
Influenza B by PCR: NEGATIVE
SARS Coronavirus 2 by RT PCR: NEGATIVE

## 2019-10-02 NOTE — ED Notes (Signed)
Pt resting comfortably at this time. Respirations even, NAD noted, soft restraints in place.
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
## 2019-10-02 NOTE — ED Notes (Signed)
Pt sleeping when [place] sandwich & coffee given, placed at bedside for when she wakes.

## 2019-10-02 NOTE — ED Notes (Signed)
Pt given [place] sandwich & coffee

## 2019-10-02 NOTE — Discharge Instructions (Signed)
Please stop doing illegal drugs.  This is something that could make you do something that you would regret later including injuring yourself or someone else.

## 2019-10-02 NOTE — ED Notes (Signed)
Restraints removed [phone]

## 2019-10-02 NOTE — ED Notes (Signed)
Pt resting comfortably at this time. Respirations even, unlabored. NAD noted, soft restraints in place

## 2019-10-02 NOTE — ED Provider Notes (Signed)
I received the patient in signout from Dr. Rhunette Croft.  Briefly the patient is a 39 year old female who came in acutely agitated.  I suspicion for methamphetamine abuse.  Plan is for metabolize and reassess.  On my reassessment the patient is feeling much better.  She denies suicidal or homicidal ideation.  She was able to eat and drink without difficulty.  Able to ambulate.  Will discharge home.   Melene Plan, DO 10/02/19 [phone]

## 2019-11-29 ENCOUNTER — Encounter (HOSPITAL_COMMUNITY): Payer: Self-pay | Admitting: Emergency Medicine

## 2019-11-29 ENCOUNTER — Emergency Department (HOSPITAL_COMMUNITY)
Admission: EM | Admit: 2019-11-29 | Discharge: 2019-11-29 | Disposition: A | Discharge status: Home / Self Care | Payer: Medicaid Other | Attending: Emergency Medicine | Admitting: Emergency Medicine

## 2019-11-29 DIAGNOSIS — R462 Strange and inexplicable behavior: Secondary | ICD-10-CM | POA: Insufficient documentation

## 2019-11-29 DIAGNOSIS — F17210 Nicotine dependence, cigarettes, uncomplicated: Secondary | ICD-10-CM | POA: Insufficient documentation

## 2019-11-29 LAB — CBC
HCT: 34.1 % — ABNORMAL LOW (ref 36.0–46.0)
Hemoglobin: 10.7 g/dL — ABNORMAL LOW (ref 12.0–15.0)
MCH: 29.7 pg (ref 26.0–34.0)
MCHC: 31.4 g/dL (ref 30.0–36.0)
MCV: 94.7 fL (ref 80.0–100.0)
Platelets: 232 10*3/uL (ref 150–400)
RBC: 3.6 MIL/uL — ABNORMAL LOW (ref 3.87–5.11)
RDW: 12.5 % (ref 11.5–15.5)
WBC: 8.8 10*3/uL (ref 4.0–10.5)
nRBC: 0 % (ref 0.0–0.2)

## 2019-11-29 LAB — COMPREHENSIVE METABOLIC PANEL
ALT: 30 U/L (ref 0–44)
AST: 35 U/L (ref 15–41)
Albumin: 3.8 g/dL (ref 3.5–5.0)
Alkaline Phosphatase: 58 U/L (ref 38–126)
Anion gap: 10 (ref 5–15)
BUN: 16 mg/dL (ref 6–20)
CO2: 20 mmol/L — ABNORMAL LOW (ref 22–32)
Calcium: 8.8 mg/dL — ABNORMAL LOW (ref 8.9–10.3)
Chloride: 107 mmol/L (ref 98–111)
Creatinine, Ser: 0.93 mg/dL (ref 0.44–1.00)
GFR calc Af Amer: >60 mL/min (ref >60)
GFR calc non Af Amer: >60 mL/min (ref >60)
Glucose, Bld: 75 mg/dL (ref 70–99)
Potassium: 3.4 mmol/L — ABNORMAL LOW (ref 3.5–5.1)
Sodium: 137 mmol/L (ref 135–145)
Total Bilirubin: 0.4 mg/dL (ref 0.3–1.2)
Total Protein: 6 g/dL — ABNORMAL LOW (ref 6.5–8.1)

## 2019-11-29 LAB — I-STAT BETA HCG BLOOD, ED (MC, WL, AP ONLY): I-stat hCG, quantitative: 475 m[IU]/mL — ABNORMAL HIGH (ref <5)

## 2019-11-29 LAB — ETHANOL: Alcohol, Ethyl (B): 79 mg/dL — ABNORMAL HIGH (ref <10)

## 2019-11-29 LAB — CBG MONITORING, ED: Glucose-Capillary: 61 mg/dL — ABNORMAL LOW (ref 70–99)
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
## 2019-11-29 NOTE — ED Provider Notes (Signed)
[facility] EMERGENCY DEPARTMENT Provider Note   CSN: [id number] Arrival date & time: 11/29/19  1155     History Chief Complaint  Patient presents with  . ivc    Sara Vazquez is a 39 y.o. female.  HPI    39yF brought in by police under IVC. Pt was found in a cemetary topless. She was holding an opossum and wouldn't provide police with her name.  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 She was in the cemetary doing her "rites." The opossum is one of her "totem animals" and she had it out and was trying to get it acclimated to the rituals. She says she has participated in this type of activity for years to the point that she says many of the cemetary attendants are familiar with her and generally leave her alone. She says she really should be naked for these rituals but compromises because she understands being fully naked bothers most people. She was wearing shorts and "chains to represent bondage." She denies wanting to harm anyone or herself. She provided me her legal name although says she goes by many names.   Past Medical History:  Diagnosis Date  . Kidney stones     Patient Active Problem List   Diagnosis Date Noted  . Alcohol abuse with alcohol-induced mood disorder (HCC) 03/22/2018  . Polysubstance abuse (HCC) 06/13/2017    Past Surgical History:  Procedure Laterality Date  . ABDOMINAL SURGERY    . TONSILLECTOMY       OB History    Gravida  5   Para  5   Term  4   Preterm  1   AB      Living  4     SAB      TAB      Ectopic      Multiple      Live Births              No family history on file.  Social History   Tobacco Use  . Smoking status: Current Every Day Smoker    Types: Cigarettes  . Smokeless tobacco: Never Used  Substance Use Topics  . Alcohol use: Yes    Comment: occasional  . Drug use: Yes    Types: Marijuana, Cocaine, Methamphetamines    Home Medications Prior to Admission medications     Medication Sig Start Date End Date Taking? Authorizing Provider  ibuprofen (ADVIL,MOTRIN) 800 MG tablet Take 1 tablet (800 mg total) by mouth every 8 (eight) hours as needed. Patient not taking: Reported on 12/29/2018 04/08/18   Terrilee Files, MD    Allergies    Shellfish allergy and Sulfa antibiotics  Review of Systems   Review of Systems All systems reviewed and negative, other than as noted in HPI.  Physical Exam Updated Vital Signs BP 117/65   Pulse 66   Temp (!) 97.5 F (36.4 C) (Axillary)   Resp 20   Ht 5\' 7"  (1.702 m)   SpO2 100%   BMI 20.36 kg/m   Physical Exam Vitals and nursing note reviewed.  Constitutional:      General: She is not in acute distress.    Appearance: She is well-developed.     Comments: Disheveled appearance. NAD.   HENT:     Head: Normocephalic and atraumatic.  Eyes:     General:        Right eye: No discharge.        Left  eye: No discharge.     Conjunctiva/sclera: Conjunctivae normal.  Cardiovascular:     Rate and Rhythm: Normal rate and regular rhythm.     Heart sounds: Normal heart sounds. No murmur. No friction rub. No gallop.   Pulmonary:     Effort: Pulmonary effort is normal. No respiratory distress.     Breath sounds: Normal breath sounds.  Abdominal:     General: There is no distension.     Palpations: Abdomen is soft.     Tenderness: There is no abdominal tenderness.  Musculoskeletal:        General: No tenderness.     Cervical back: Neck supple.  Skin:    General: Skin is warm and dry.  Neurological:     Mental Status: She is alert and oriented to person, place, and time.     ED Results / Procedures / Treatments   Labs (all labs ordered are listed, but only abnormal results are displayed) Labs Reviewed  CBC - Abnormal; Notable for the following components:      Result Value   RBC 3.60 (*)    Hemoglobin 10.7 (*)    HCT 34.1 (*)    All other components within normal limits  CBG MONITORING, ED - Abnormal;  Notable for the following components:   Glucose-Capillary 61 (*)    All other components within normal limits  COMPREHENSIVE METABOLIC PANEL  ETHANOL  RAPID URINE DRUG SCREEN, HOSP PERFORMED  I-STAT BETA HCG BLOOD, ED ([facility], [facility], [facility] ONLY)    EKG None  Radiology No results found.  Procedures Procedures (including critical care time)  Medications Ordered in ED Medications - No data to display  ED Course  I have reviewed the triage vital signs and the nursing notes.  Pertinent labs & imaging results that were available during my care of the patient were reviewed by me and considered in my medical decision making (see chart for details).    MDM Rules/Calculators/A&P                      39yF with bizarre behavior. She denies any intention of wanting to harm herself or anyone else. Aside from likely trespassing in a cemetary, it doesn't sound like she was otherwise bothering anyone. 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 She is reasonably calm. She can provide me her legal name and is oriented to place, time and situation. She has been talking with a friend on the phone and says they can pick her up. She says she has a place to go and feels safe. I certainly find her behavior to be strange but this sounds like her baseline and I don't feel she is in imminent threat to herself or anyone else at this time.    Final Clinical Impression(s) / ED Diagnoses Final diagnoses:  Bizarre behavior    Rx / DC Orders ED Discharge Orders    None       Virgel Manifold, MD 11/29/19 1244

## 2019-11-29 NOTE — ED Notes (Signed)
Pts IVC resended. Pt discharged home. Ambulatory on discharge.

## 2019-12-09 ENCOUNTER — Emergency Department (HOSPITAL_COMMUNITY)
Admission: EM | Admit: 2019-12-09 | Discharge: 2019-12-10 | Disposition: A | Discharge status: Home / Self Care | Payer: Self-pay | Attending: Emergency Medicine | Admitting: Emergency Medicine

## 2019-12-09 ENCOUNTER — Other Ambulatory Visit: Payer: Self-pay

## 2019-12-09 ENCOUNTER — Encounter (HOSPITAL_COMMUNITY): Payer: Self-pay | Admitting: Emergency Medicine

## 2019-12-09 DIAGNOSIS — Z3A01 Less than 8 weeks gestation of pregnancy: Secondary | ICD-10-CM | POA: Insufficient documentation

## 2019-12-09 DIAGNOSIS — F17210 Nicotine dependence, cigarettes, uncomplicated: Secondary | ICD-10-CM | POA: Insufficient documentation

## 2019-12-09 LAB — CBC
HCT: 39.9 % (ref 36.0–46.0)
Hemoglobin: 12.6 g/dL (ref 12.0–15.0)
MCH: 30.2 pg (ref 26.0–34.0)
MCHC: 31.6 g/dL (ref 30.0–36.0)
MCV: 95.7 fL (ref 80.0–100.0)
Platelets: 356 10*3/uL (ref 150–400)
RBC: 4.17 MIL/uL (ref 3.87–5.11)
RDW: 13.2 % (ref 11.5–15.5)
WBC: 13 10*3/uL — ABNORMAL HIGH (ref 4.0–10.5)
nRBC: 0 % (ref 0.0–0.2)

## 2019-12-09 LAB — COMPREHENSIVE METABOLIC PANEL
ALT: 26 U/L (ref 0–44)
AST: 24 U/L (ref 15–41)
Albumin: 4.2 g/dL (ref 3.5–5.0)
Alkaline Phosphatase: 63 U/L (ref 38–126)
Anion gap: 9 (ref 5–15)
BUN: 15 mg/dL (ref 6–20)
CO2: 26 mmol/L (ref 22–32)
Calcium: 9.8 mg/dL (ref 8.9–10.3)
Chloride: 100 mmol/L (ref 98–111)
Creatinine, Ser: 1.15 mg/dL — ABNORMAL HIGH (ref 0.44–1.00)
GFR calc Af Amer: >60 mL/min (ref >60)
GFR calc non Af Amer: 59 mL/min — ABNORMAL LOW (ref >60)
Glucose, Bld: 99 mg/dL (ref 70–99)
Potassium: 4.2 mmol/L (ref 3.5–5.1)
Sodium: 135 mmol/L (ref 135–145)
Total Bilirubin: 0.8 mg/dL (ref 0.3–1.2)
Total Protein: 7 g/dL (ref 6.5–8.1)

## 2019-12-09 LAB — I-STAT BETA HCG BLOOD, ED (MC, WL, AP ONLY): I-stat hCG, quantitative: 1926.1 m[IU]/mL — ABNORMAL HIGH (ref <5)

## 2019-12-09 LAB — LIPASE, BLOOD: Lipase: 45 U/L (ref 11–51)

## 2019-12-09 MED ORDER — SODIUM CHLORIDE 0.9% FLUSH: 3.0000 mL | Freq: Once | INTRAVENOUS | Status: DC

## 2019-12-09 NOTE — ED Notes (Signed)
Pt did not answer called multiple times  °

## 2019-12-09 NOTE — ED Triage Notes (Signed)
Patient arrives to ED with complaints of generalized abdominal pain starting today. Patient thinks she may be having a baby. Patient states she also has led bullets in her arm that she wants removed. Patient continues to ask for a job here. No N/V/D.

## 2019-12-10 ENCOUNTER — Emergency Department (HOSPITAL_COMMUNITY): Payer: Self-pay

## 2019-12-10 LAB — URINALYSIS, ROUTINE W REFLEX MICROSCOPIC
Bilirubin Urine: NEGATIVE
Glucose, UA: NEGATIVE mg/dL
Hgb urine dipstick: NEGATIVE
Ketones, ur: NEGATIVE mg/dL
Leukocytes,Ua: NEGATIVE
Nitrite: NEGATIVE
Protein, ur: NEGATIVE mg/dL
Specific Gravity, Urine: 1.023 (ref 1.005–1.030)
pH: 5 (ref 5.0–8.0)

## 2019-12-10 LAB — HCG, QUANTITATIVE, PREGNANCY: hCG, Beta Chain, Quant, S: 1720 m[IU]/mL — ABNORMAL HIGH (ref <5)

## 2019-12-10 MED ORDER — PRENATAL COMPLETE 14-0.4 MG PO TABS: 1.0000 | ORAL_TABLET | Freq: Every day | ORAL | 0 refills | Status: AC

## 2019-12-10 NOTE — ED Notes (Signed)
Pt not present in the lobby when called.Spoke to pt multiple times stay in lobby.

## 2019-12-10 NOTE — ED Provider Notes (Signed)
[facility] EMERGENCY DEPARTMENT Provider Note   CSN: [id number] Arrival date & time: 12/09/19  1357     History Chief Complaint  Patient presents with  . Abdominal Pain  . Hallucinations    Sara Vazquez is a 39 y.o. female.  The history is provided by the patient and medical records.  Abdominal Pain Associated symptoms: nausea     39 year old female with history of substance-induced mood disorder, polysubstance abuse, presenting to the ED with diffuse abdominal discomfort.  States she really started noticing this yesterday.  States it seems more localized to her left pelvic region.  She was concerned she was pregnant as her periods have been irregular, somewhat spotting.  None in the past few weeks.  She also reports a lot of nausea in the morning without vomiting.  [place] boyfriend and does not get proper nutrition.  She does report history of ectopic pregnancy in 2019 and was concerned for same.  Past Medical History:  Diagnosis Date  . Kidney stones     Patient Active Problem List   Diagnosis Date Noted  . Alcohol abuse with alcohol-induced mood disorder ([place]) 03/22/2018  . Polysubstance abuse ([place]) 06/13/2017    Past Surgical History:  Procedure Laterality Date  . ABDOMINAL SURGERY    . TONSILLECTOMY       OB History    Gravida  5   Para  5   Term  4   Preterm  1   AB      Living  4     SAB      TAB      Ectopic      Multiple      Live Births              History reviewed. No pertinent family history.  Social History   Tobacco Use  . Smoking status: Current Every Day Smoker    Types: Cigarettes  . Smokeless tobacco: Never Used  Substance Use Topics  . Alcohol use: Yes    Comment: occasional  . Drug use: Yes    Types: Marijuana, Cocaine, Methamphetamines    Home Medications Prior to Admission medications   Medication Sig Start Date End Date Taking?  Authorizing Provider  ibuprofen (ADVIL,MOTRIN) 800 MG tablet Take 1 tablet (800 mg total) by mouth every 8 (eight) hours as needed. Patient not taking: Reported on 12/29/2018 04/08/18   Hayden Rasmussen, MD    Allergies    Shellfish allergy and Sulfa antibiotics  Review of Systems   Review of Systems  Gastrointestinal: Positive for abdominal pain and nausea.  All other systems reviewed and are negative.   Physical Exam Updated Vital Signs BP 128/88 (BP Location: Right Arm)   Pulse 82   Temp 98 F (36.7 C)   Resp 16   Ht 5\' 7"  (1.702 m)   Wt 54.4 kg   SpO2 100%   BMI 18.79 kg/m   Physical Exam Vitals and nursing note reviewed.  Constitutional:      Appearance: She is well-developed.     Comments: Thin, disheveled appearing  HENT:     Head: Normocephalic and atraumatic.  Eyes:     Conjunctiva/sclera: Conjunctivae normal.     Pupils: Pupils are equal, round, and reactive to light.  Cardiovascular:     Rate and Rhythm: Normal rate and regular rhythm.     Heart sounds: Normal heart sounds.  Pulmonary:  Effort: Pulmonary effort is normal.     Breath sounds: Normal breath sounds.  Abdominal:     General: Bowel sounds are normal.     Palpations: Abdomen is soft.     Tenderness: There is no abdominal tenderness. There is no guarding or rebound.     Comments: Soft, non-tender, no distention noted  Musculoskeletal:        General: Normal range of motion.     Cervical back: Normal range of motion.     Comments: Ankle monitor in place  Skin:    General: Skin is warm and dry.  Neurological:     Mental Status: She is alert and oriented to person, place, and time.  Psychiatric:     Comments: Calm, cooperative with exam, able to give adequate history, answers questions and follows commands as asked, does not appear to be responding to internal stimuli     ED Results / Procedures / Treatments   Labs (all labs ordered are listed, but only abnormal results are  displayed) Labs Reviewed  COMPREHENSIVE METABOLIC PANEL - Abnormal; Notable for the following components:      Result Value   Creatinine, Ser 1.15 (*)    GFR calc non Af Amer 59 (*)    All other components within normal limits  CBC - Abnormal; Notable for the following components:   WBC 13.0 (*)    All other components within normal limits  URINALYSIS, ROUTINE W REFLEX MICROSCOPIC - Abnormal; Notable for the following components:   APPearance HAZY (*)    All other components within normal limits  HCG, QUANTITATIVE, PREGNANCY - Abnormal; Notable for the following components:   hCG, Beta Chain, Quant, S 1,720 (*)    All other components within normal limits  I-STAT BETA HCG BLOOD, ED ([facility], [facility], [facility] ONLY) - Abnormal; Notable for the following components:   I-stat hCG, quantitative 1,926.1 (*)    All other components within normal limits  LIPASE, BLOOD    EKG None  Radiology US OB Comp Less 14 Wks  Result Date: 12/10/2019 CLINICAL DATA:  Initial evaluation for new pregnancy, established IUP. History of ectopic. EXAM: OBSTETRIC <14 WK ULTRASOUND TECHNIQUE: Transabdominal ultrasound was performed for evaluation of the gestation as well as the maternal uterus and adnexal regions. COMPARISON:  None available. FINDINGS: Intrauterine gestational sac: Probable small early gestational sac seen within the endometrial cavity. Yolk sac:  Not visualized. Embryo:  Not visualized. Cardiac Activity: Negative. Heart Rate: N/A bpm MSD:  4.6 mm   5 w   2 d Subchorionic hemorrhage:  None visualized. Maternal uterus/adnexae: Left ovary within normal limits. 2.7 x 2.0 x 2.4 cm complex right ovarian cyst with scattered low-level internal echoes, which could reflect a small hemorrhagic cyst. No other adnexal mass or free fluid. Diffuse prominence of the parametrial vessels noted about the uterus. IMPRESSION: 1. Probable early intrauterine gestational sac, but no yolk sac, fetal pole, or cardiac activity yet visualized.  Recommend follow-up quantitative B-HCG levels and follow-up US in 14 days to confirm and assess viability. This recommendation follows SRU consensus guidelines: Diagnostic Criteria for Nonviable Pregnancy Early in the First Trimester. Malva Limes Med 2013; 400:8676-19. 2. 2.7 cm complex right ovarian cyst, which could reflect a small hemorrhagic cyst or possibly endometrioma. Attention at follow-up recommended. 3. No other acute maternal uterine or adnexal abnormality identified. Electronically Signed   By: Rise Mu M.D.   On: 12/10/2019 05:45    Procedures Procedures (including critical care time)  Medications Ordered in ED Medications  sodium chloride flush (NS) 0.9 % injection 3 mL (has no administration in time range)    ED Course  I have reviewed the triage vital signs and the nursing notes.  Pertinent labs & imaging results that were available during my care of the patient were reviewed by me and considered in my medical decision making (see chart for details).    MDM Rules/Calculators/A&P  39 year old female presenting to the ED with concern of possible pregnancy.  States she has had some nausea in the morning, irregular menses, and abdominal cramping over the past few days.  She does report history of ectopic in the past.  She is afebrile and nontoxic in appearance here.  Abdominal exam is overall benign and she appears comfortable.  She does report being hungry.  Her screening labs are reassuring, does appear she is approximately [redacted] weeks pregnant or so.  She cannot tell me the date of her last menstrual period as they have been irregular with spotting (states she is not sure which is period and which is spotting).  Will obtain US to assess for IUP given her history.  Ultrasound with findings of likely developing IUP, however no fetal pole or cardiac activity noted.  Likely early pregnancy and not able to visualize yet.  Results discussed with patient, she seems very happy that  she is pregnant.  I discussed with her options for obtaining prenatal care as this will be important including starting prenatal vitamins today.  She was given information for the women's clinic in which to follow-up, also advised of [facility] if needed for severe pain, bleeding, or other complications related to pregnancy.  She may return here for any new or acute changes.  Final Clinical Impression(s) / ED Diagnoses Final diagnoses:  Less than [redacted] weeks gestation of pregnancy    Rx / DC Orders ED Discharge Orders         Ordered    Prenatal Vit-Fe Fumarate-FA (PRENATAL COMPLETE) 14-0.4 MG TABS  Daily     12/10/19 0602           Garlon Hatchet, PA-C 12/10/19 2297    Marily Memos, MD 12/10/19 [phone]

## 2019-12-10 NOTE — ED Notes (Signed)
Patient verbalizes understanding of discharge instructions. Opportunity for questioning and answers were provided. Armband removed by staff, pt discharged from ED. Pt. ambulatory and discharged home.  

## 2019-12-10 NOTE — ED Notes (Signed)
Pt did not answer called multiple times  °

## 2019-12-10 NOTE — Discharge Instructions (Signed)
Please follow-up with the women's clinic in about 2 weeks for re-check of hcg and ultrasound to monitor pregnancy since it was very early today. Please start your prenatal vitamins-- these are very important for fetal development. Return to [facility] for any new/acute changes related to pregnancy-- severe pain, bleeding, etc.

## 2020-01-03 ENCOUNTER — Encounter (HOSPITAL_COMMUNITY): Payer: Self-pay

## 2020-01-03 ENCOUNTER — Emergency Department (HOSPITAL_COMMUNITY)
Admission: EM | Admit: 2020-01-03 | Discharge: 2020-01-03 | Disposition: A | Discharge status: Home / Self Care | Payer: Self-pay | Attending: Emergency Medicine | Admitting: Emergency Medicine

## 2020-01-03 ENCOUNTER — Other Ambulatory Visit: Payer: Self-pay

## 2020-01-03 ENCOUNTER — Emergency Department (HOSPITAL_COMMUNITY): Payer: Self-pay

## 2020-01-03 DIAGNOSIS — O039 Complete or unspecified spontaneous abortion without complication: Secondary | ICD-10-CM | POA: Insufficient documentation

## 2020-01-03 DIAGNOSIS — F17210 Nicotine dependence, cigarettes, uncomplicated: Secondary | ICD-10-CM | POA: Insufficient documentation

## 2020-01-03 LAB — COMPREHENSIVE METABOLIC PANEL
ALT: 21 U/L (ref 0–44)
AST: 21 U/L (ref 15–41)
Albumin: 4.6 g/dL (ref 3.5–5.0)
Alkaline Phosphatase: 62 U/L (ref 38–126)
Anion gap: 9 (ref 5–15)
BUN: 16 mg/dL (ref 6–20)
CO2: 28 mmol/L (ref 22–32)
Calcium: 9.7 mg/dL (ref 8.9–10.3)
Chloride: 102 mmol/L (ref 98–111)
Creatinine, Ser: 0.76 mg/dL (ref 0.44–1.00)
GFR calc Af Amer: >60 mL/min (ref >60)
GFR calc non Af Amer: >60 mL/min (ref >60)
Glucose, Bld: 101 mg/dL — ABNORMAL HIGH (ref 70–99)
Potassium: 4.2 mmol/L (ref 3.5–5.1)
Sodium: 139 mmol/L (ref 135–145)
Total Bilirubin: 0.4 mg/dL (ref 0.3–1.2)
Total Protein: 7.9 g/dL (ref 6.5–8.1)

## 2020-01-03 LAB — URINALYSIS, ROUTINE W REFLEX MICROSCOPIC
Bacteria, UA: NONE SEEN
Bilirubin Urine: NEGATIVE
Glucose, UA: NEGATIVE mg/dL
Ketones, ur: NEGATIVE mg/dL
Leukocytes,Ua: NEGATIVE
Nitrite: NEGATIVE
Protein, ur: NEGATIVE mg/dL
RBC / HPF: >50 RBC/hpf — ABNORMAL HIGH (ref 0–5)
Specific Gravity, Urine: 1.011 (ref 1.005–1.030)
pH: 7 (ref 5.0–8.0)

## 2020-01-03 LAB — WET PREP, GENITAL
Clue Cells Wet Prep HPF POC: NONE SEEN
Sperm: NONE SEEN
Trich, Wet Prep: NONE SEEN
Yeast Wet Prep HPF POC: NONE SEEN

## 2020-01-03 LAB — CBC
HCT: 41.9 % (ref 36.0–46.0)
Hemoglobin: 13.4 g/dL (ref 12.0–15.0)
MCH: 30 pg (ref 26.0–34.0)
MCHC: 32 g/dL (ref 30.0–36.0)
MCV: 93.9 fL (ref 80.0–100.0)
Platelets: 303 10*3/uL (ref 150–400)
RBC: 4.46 MIL/uL (ref 3.87–5.11)
RDW: 12.4 % (ref 11.5–15.5)
WBC: 10.6 10*3/uL — ABNORMAL HIGH (ref 4.0–10.5)
nRBC: 0 % (ref 0.0–0.2)

## 2020-01-03 LAB — HIV ANTIBODY (ROUTINE TESTING W REFLEX): HIV Screen 4th Generation wRfx: NONREACTIVE

## 2020-01-03 LAB — HCG, QUANTITATIVE, PREGNANCY: hCG, Beta Chain, Quant, S: 7827 m[IU]/mL — ABNORMAL HIGH (ref <5)

## 2020-01-03 MED ORDER — ACETAMINOPHEN 325 MG PO TABS
650.0000 mg | ORAL_TABLET | Freq: Once | ORAL | Status: AC
  Administered 2020-01-03: 650 mg via ORAL
  Filled 2020-01-03: qty 2

## 2020-01-03 NOTE — ED Triage Notes (Signed)
Pt presents with c/o vaginal bleeding. Pt reports she was seen in the ER for abdominal pain recently and was confirmed pregnant. Pt reports she does not know how far along she is.

## 2020-01-03 NOTE — ED Provider Notes (Signed)
[facility]-EMERGENCY DEPT Provider Note   CSN: [id number] Arrival date & time: 01/03/20  1208     History Chief Complaint  Patient presents with  . Vaginal Bleeding    Sara Vazquez is a 39 y.o. female who presents to the ED with a cc of vaginal bleeding. She has a hx of tubal ligation and previous ectopic pregnancy. The patient came to the ED with abdominal pain on 6/4 and found out that she was pregnant. She came to the ed due to bleeding. She began having some intermittent pink spotting 4 days ago. Her bleeding increased significantly today and she has noticed passage of clots. She has Suprapubic pain worse on the R side. She denies lightheadedness or sob. Denies urinary sxs.   HPI     Past Medical History:  Diagnosis Date  . Kidney stones     Patient Active Problem List   Diagnosis Date Noted  . Alcohol abuse with alcohol-induced mood disorder (HCC) 03/22/2018  . Polysubstance abuse (HCC) 06/13/2017    Past Surgical History:  Procedure Laterality Date  . ABDOMINAL SURGERY    . TONSILLECTOMY       OB History    Gravida  6   Para  5   Term  4   Preterm  1   AB      Living  4     SAB      TAB      Ectopic      Multiple      Live Births              History reviewed. No pertinent family history.  Social History   Tobacco Use  . Smoking status: Current Every Day Smoker    Types: Cigarettes  . Smokeless tobacco: Never Used  Vaping Use  . Vaping Use: Never used  Substance Use Topics  . Alcohol use: Yes    Comment: occasional  . Drug use: Yes    Types: Marijuana, Cocaine, Methamphetamines    Home Medications Prior to Admission medications   Medication Sig Start Date End Date Taking? Authorizing Provider  ibuprofen (ADVIL,MOTRIN) 800 MG tablet Take 1 tablet (800 mg total) by mouth every 8 (eight) hours as needed. Patient not taking: Reported on 12/29/2018 04/08/18   Terrilee Files, MD  Prenatal  Vit-Fe Fumarate-FA (PRENATAL COMPLETE) 14-0.4 MG TABS Take 1 tablet by mouth daily. Patient not taking: Reported on 01/03/2020 12/10/19   Garlon Hatchet, PA-C    Allergies    Shellfish allergy and Sulfa antibiotics  Review of Systems   Review of Systems Ten systems reviewed and are negative for acute change, except as noted in the HPI.   Physical Exam Updated Vital Signs BP 119/89 (BP Location: Left Arm)   Pulse 72   Temp 98 F (36.7 C) (Oral)   Resp 16   SpO2 100%   Physical Exam Vitals and nursing note reviewed.  Constitutional:      General: She is not in acute distress.    Appearance: She is well-developed. She is not diaphoretic.  HENT:     Head: Normocephalic and atraumatic.  Eyes:     General: No scleral icterus.    Conjunctiva/sclera: Conjunctivae normal.  Cardiovascular:     Rate and Rhythm: Normal rate and regular rhythm.     Heart sounds: Normal heart sounds. No murmur heard.  No friction rub. No gallop.   Pulmonary:     Effort: Pulmonary effort is  normal. No respiratory distress.     Breath sounds: Normal breath sounds.  Abdominal:     General: Bowel sounds are normal. There is no distension.     Palpations: Abdomen is soft. There is no mass.     Tenderness: There is no abdominal tenderness. There is no guarding.  Genitourinary:    Comments: Pelvic exam: examination not indicated, VULVA: normal appearing vulva with no masses, tenderness or lesions, VAGINA: normal appearing vagina with normal color and discharge, no lesions, CERVIX: normal appearing cervix without discharge or lesions, cervical discharge present - bloody, DNA probe for chlamydia and GC obtained, cervical motion tenderness absent, ADNEXA: tenderness right.  Musculoskeletal:     Cervical back: Normal range of motion.  Skin:    General: Skin is warm and dry.  Neurological:     Mental Status: She is alert and oriented to person, place, and time.  Psychiatric:        Behavior: Behavior normal.       ED Results / Procedures / Treatments   Labs (all labs ordered are listed, but only abnormal results are displayed) Labs Reviewed  WET PREP, GENITAL - Abnormal; Notable for the following components:      Result Value   WBC, Wet Prep HPF POC FEW (*)    All other components within normal limits  CBC - Abnormal; Notable for the following components:   WBC 10.6 (*)    All other components within normal limits  COMPREHENSIVE METABOLIC PANEL - Abnormal; Notable for the following components:   Glucose, Bld 101 (*)    All other components within normal limits  HCG, QUANTITATIVE, PREGNANCY - Abnormal; Notable for the following components:   hCG, Beta Chain, Quant, S 7,827 (*)    All other components within normal limits  URINALYSIS, ROUTINE W REFLEX MICROSCOPIC - Abnormal; Notable for the following components:   Color, Urine STRAW (*)    Hgb urine dipstick LARGE (*)    RBC / HPF >50 (*)    All other components within normal limits  RPR  HIV ANTIBODY (ROUTINE TESTING W REFLEX)  GC/CHLAMYDIA PROBE AMP ([place]) NOT AT [facility]    EKG None  Radiology US OB Comp < 14 Wks  Result Date: 01/03/2020 CLINICAL DATA:  Bleeding EXAM: OBSTETRIC <14 WK ULTRASOUND TECHNIQUE: Transabdominal ultrasound was performed for evaluation of the gestation as well as the maternal uterus and adnexal regions. COMPARISON:  12/10/2019 FINDINGS: Intrauterine gestational sac: None Yolk sac:  Not visualized Embryo:  Not visualized Cardiac Activity: Not visualized Heart Rate:  bpm MSD:    mm    w     d CRL:     mm    w  d                  [place] EDC: Subchorionic hemorrhage:  None visualized. Maternal uterus/adnexae: 2.8 cm complex cystic area in the left ovary, likely hemorrhagic cyst. Probable collapsing corpus luteal cyst in the right ovary. IMPRESSION: Previously seen questionable gestational sac no longer visualized. No intrauterine gestation visualized. Complex cystic areas within the ovaries bilaterally, the largest  2.8 cm in the left ovary, likely hemorrhagic cyst. Electronically Signed   By: Charlett Nose M.D.   On: 01/03/2020 15:33    Procedures Procedures (including critical care time)  Medications Ordered in ED Medications  acetaminophen (TYLENOL) tablet 650 mg (650 mg Oral Given 01/03/20 1441)    ED Course  I have reviewed the triage vital signs and  the nursing notes.  Pertinent labs & imaging results that were available during my care of the patient were reviewed by me and considered in my medical decision making (see chart for details).    MDM Rules/Calculators/A&P                          XF:GHWEXHB bleeding in early pregnancy VS: BP 119/89 (BP Location: Left Arm)   Pulse 72   Temp 98 F (36.7 C) (Oral)   Resp 16   SpO2 100%  ZJ:IRCVELF is gathered by patient and emr. Previous records obtained and reviewed. DDX:The patient's complaint of vaginal bleeding involves an extensive number of diagnostic and treatment options, and is a complaint that carries with it a high risk of complications, morbidity, and potential mortality. Given the large differential diagnosis, medical decision making is of high complexity. The differential diagnosis for vaginal bleeding in pregnancy less than 20 weeks includes but is not limited to the following: Ectopic pregnancy, Subchorionic hematoma, First Trimester Abortion, Gestational trophoblastic disease, Heterotopic pregnancy, Implantation bleeding, Molar pregnancy, Cervicitis, Fibroids, Vaginal Trauma Labs: I ordered reviewed and interpreted labs which include UA which is negative for infection, wet prep which shows a few white blood cells and no significant findings, chlamydia probe pending, hCG elevated from previous of 2 almost 8000 without last known menstrual period, CMP and CBC without significant abnormality. Imaging: I ordered and reviewed images which included transvaginal ultrasound. I independently visualized and interpreted all imaging. Significant  findings include no visible gestational sac when compared with previous the previous imaging on 12/10/2019, multiple complex cysts within the ovaries, 1 hemorrhagic cyst present likely the cause of her pain.  EKG: Consults: [facility] APP on call who will schedule patient for 1 week follow-up at [facility] MDM: Patient here with vaginal bleeding in early pregnancy.  This appears to be a complete abortion.  I suspect that her pelvic pain is secondary to hemorrhagic cyst.  She states this feels similar.  I have low suspicion for heterotopic pregnancy.  I discussed return precautions and outpatient follow-up with the patient. Patient disposition: Discharge The patient appears reasonably screened and/or stabilized for discharge and I doubt any other medical condition or other [facility] requiring further screening, evaluation, or treatment in the ED at this time prior to discharge. I have discussed lab and/or imaging findings with the patient and answered all questions/concerns to the best of my ability.I have discussed return precautions and OP follow up.    Final Clinical Impression(s) / ED Diagnoses Final diagnoses:  Spontaneous miscarriage    Rx / DC Orders ED Discharge Orders    None       Arthor Captain, PA-C 01/04/20 1217    Linwood Dibbles, MD 01/06/20 [phone]

## 2020-01-03 NOTE — Discharge Instructions (Addendum)
Contact a health care provider if: You have a fever or chills. You have a foul smelling vaginal discharge. You have more bleeding instead of less. Get help right away if: You have severe cramps or pain in your back or abdomen. You pass blood clots or tissue from your vagina that is walnut-sized or larger. You soak more than 1 regular sanitary pad in an hour. You become light-headed or weak. You pass out. You have feelings of sadness that take over your thoughts, or you have thoughts of hurting yourself.

## 2020-01-04 ENCOUNTER — Telehealth: Payer: Self-pay | Admitting: General Practice

## 2020-01-04 LAB — GC/CHLAMYDIA PROBE AMP (~~LOC~~) NOT AT ARMC
Chlamydia: NEGATIVE
Comment: NEGATIVE
Comment: NORMAL
Neisseria Gonorrhea: NEGATIVE

## 2020-01-04 LAB — RPR: RPR Ser Ql: NONREACTIVE

## 2020-01-04 NOTE — Telephone Encounter (Signed)
Unable to reach patient on cell or home phone number and it states call rejected then it automatically hangs up.  Therefore, you can not leave a message on either phone.

## 2020-01-04 NOTE — Telephone Encounter (Signed)
-----   Message from Calvert Cantor, [place] sent at 01/03/2020  4:17 PM EDT ----- Regarding: Miscarriage Followup Please schedule for non-stat Quant hCG in one week. Seen at [facility] for miscarriage today.

## 2022-01-25 IMAGING — US US OB COMP LESS 14 WK
1 series · 13 of 28 positions shown · non-contrast
Comparison: None available.

CLINICAL DATA: Initial evaluation for new pregnancy, established
IUP. History of ectopic.

EXAM:
OBSTETRIC <14 WK ULTRASOUND
TECHNIQUE: Transabdominal ultrasound was performed for evaluation of the
gestation as well as the maternal uterus and adnexal regions.

[Series 1: us ob comp less 14 wks · 13 of 50 slices shown]
[im 2/50]
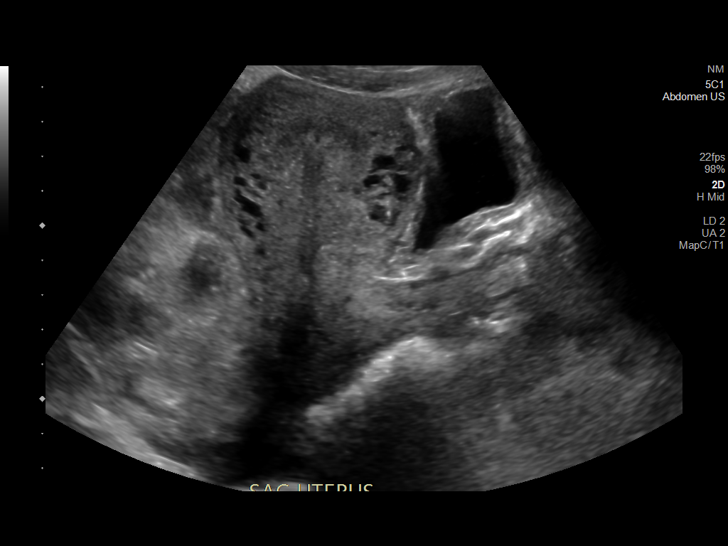
[im 6/50]
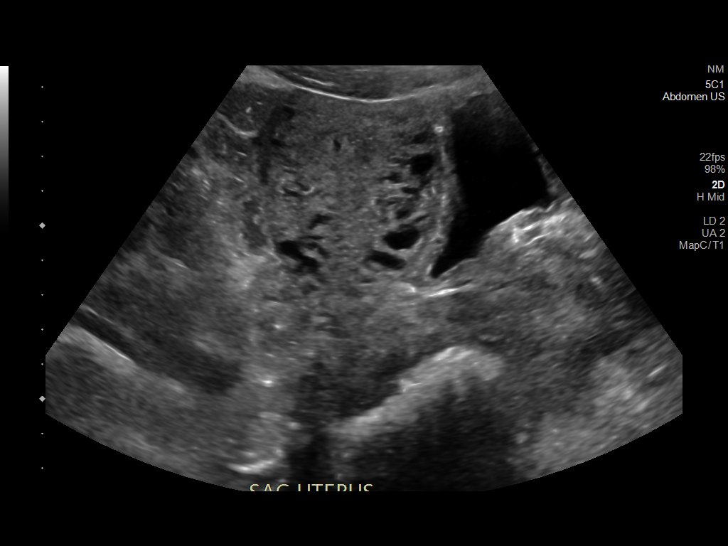
[im 10/50]
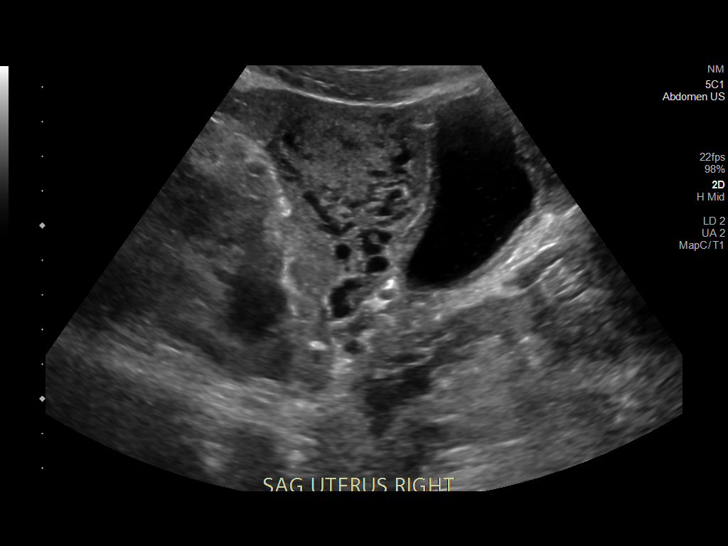
[im 13/50]
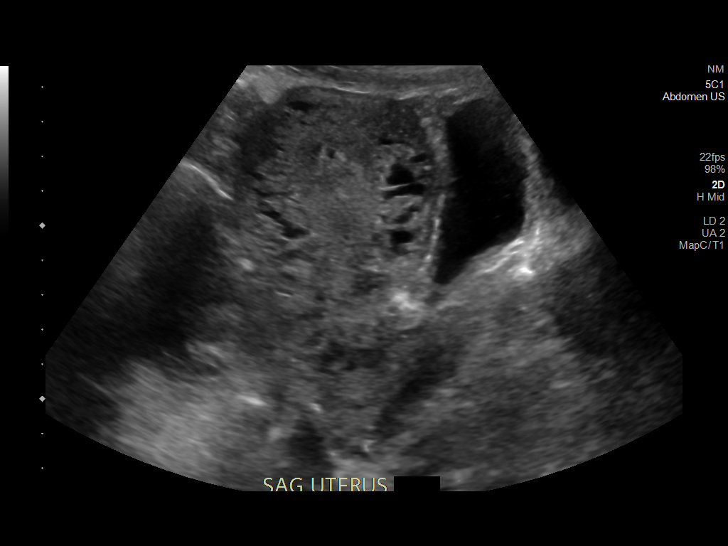
[im 17/50]
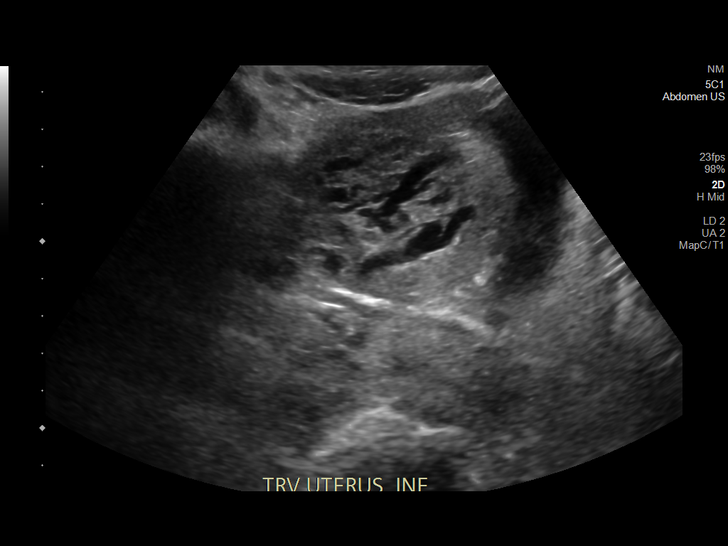
[im 20/50]
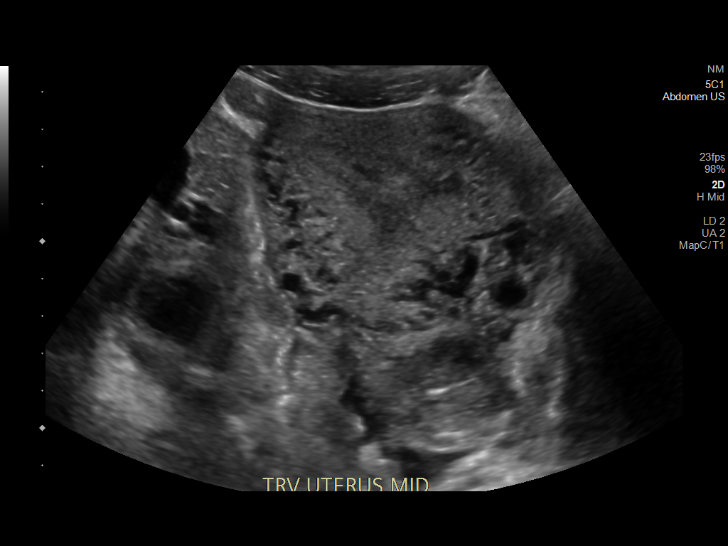
[im 26/50]
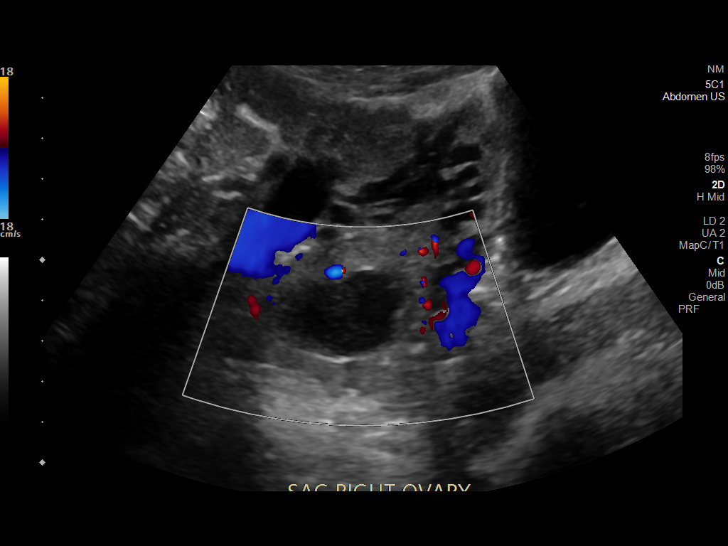
[im 30/50]
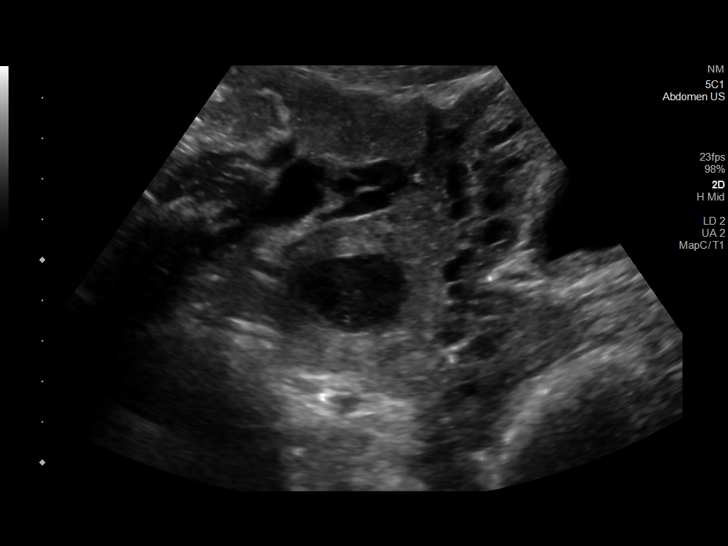
[im 33/50]
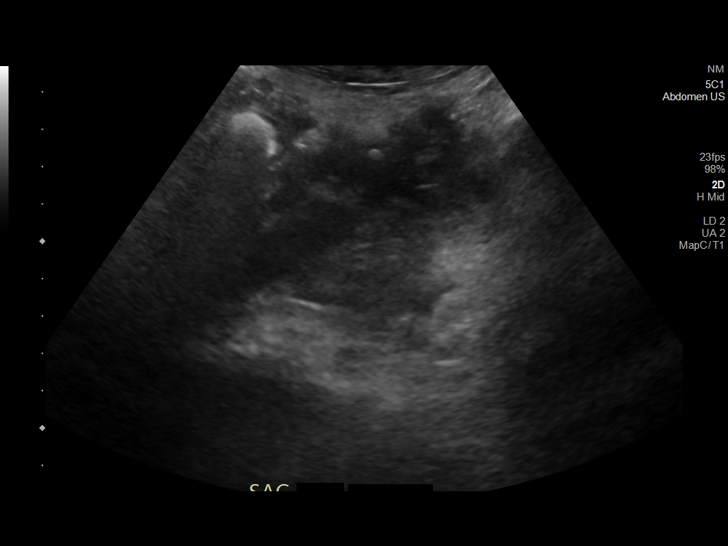
[im 37/50]
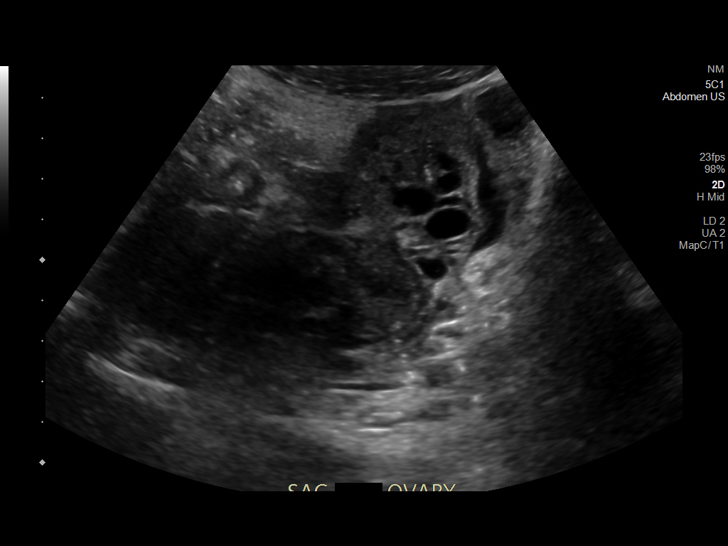
[im 40/50]
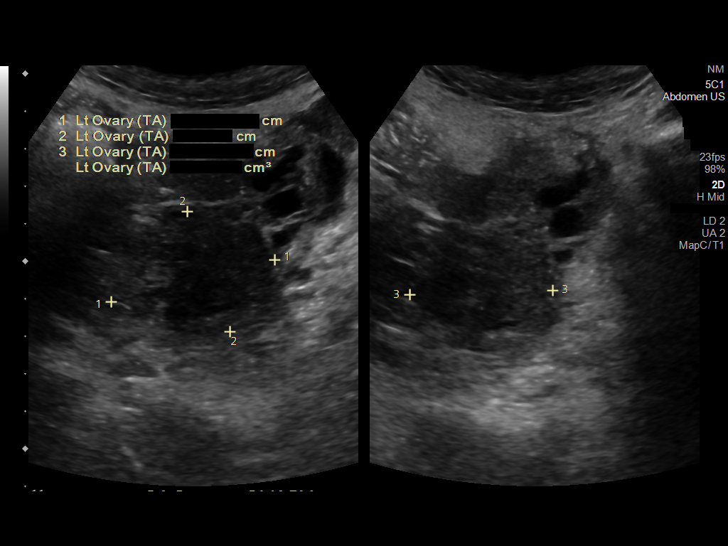
[im 44/50]
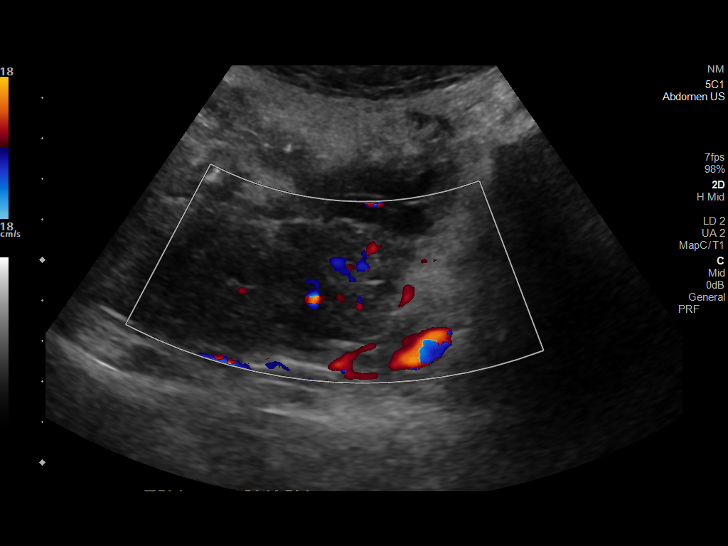
[im 48/50]
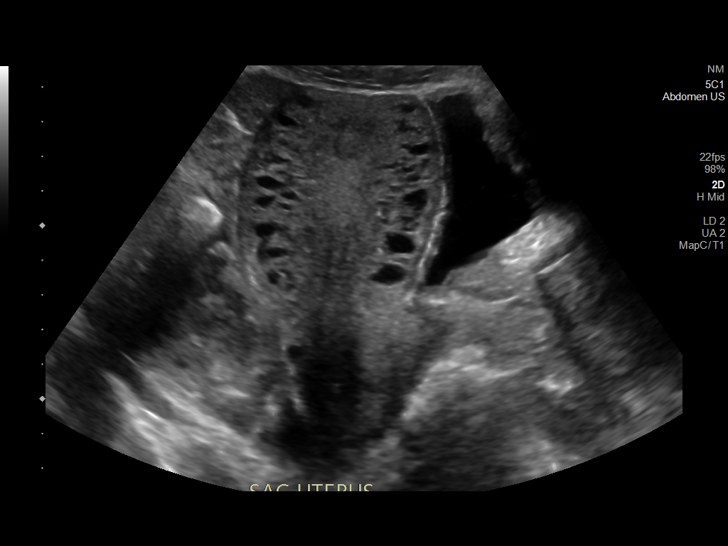

[13 of 28 positions shown; findings below may reference images not displayed]

FINDINGS: Intrauterine gestational sac: Probable small early gestational sac
seen within the endometrial cavity.

Yolk sac:  Not visualized.

Embryo:  Not visualized.

Cardiac Activity: Negative.

Heart Rate: N/A bpm

MSD:  4.6 mm   5 w   2 d

Subchorionic hemorrhage:  None visualized.

Maternal uterus/adnexae: Left ovary within normal limits. 2.7 x
x 2.4 cm complex right ovarian cyst with scattered low-level
internal echoes, which could reflect a small hemorrhagic cyst. No
other adnexal mass or free fluid. Diffuse prominence of the
parametrial vessels noted about the uterus.
IMPRESSION: 1. Probable early intrauterine gestational sac, but no yolk sac,
fetal pole, or cardiac activity yet visualized. Recommend follow-up
quantitative B-HCG levels and follow-up US in 14 days to confirm and
assess viability. This recommendation follows SRU consensus
guidelines: Diagnostic Criteria for Nonviable Pregnancy Early in the
First Trimester. N Engl J Med 2554; [DATE].
2. 2.7 cm complex right ovarian cyst, which could reflect a small
hemorrhagic cyst or possibly endometrioma. Attention at follow-up
recommended.
3. No other acute maternal uterine or adnexal abnormality
identified.

## 2022-02-18 IMAGING — US US OB COMP LESS 14 WK
1 series · 14 of 28 positions shown · non-contrast
Comparison: 12/10/2019

CLINICAL DATA: Bleeding

EXAM:
OBSTETRIC <14 WK ULTRASOUND
TECHNIQUE: Transabdominal ultrasound was performed for evaluation of the
gestation as well as the maternal uterus and adnexal regions.

[Series 1: us ob comp less 14 wk · 14 of 87 slices shown]
[im 4/87]
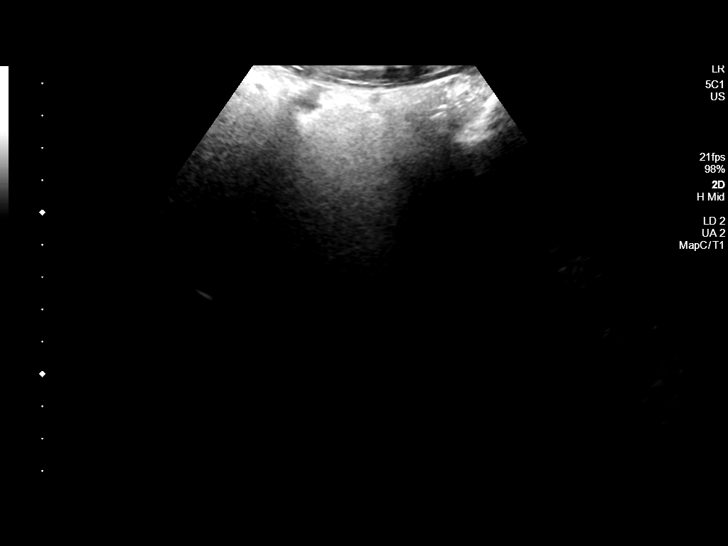
[im 10/87]
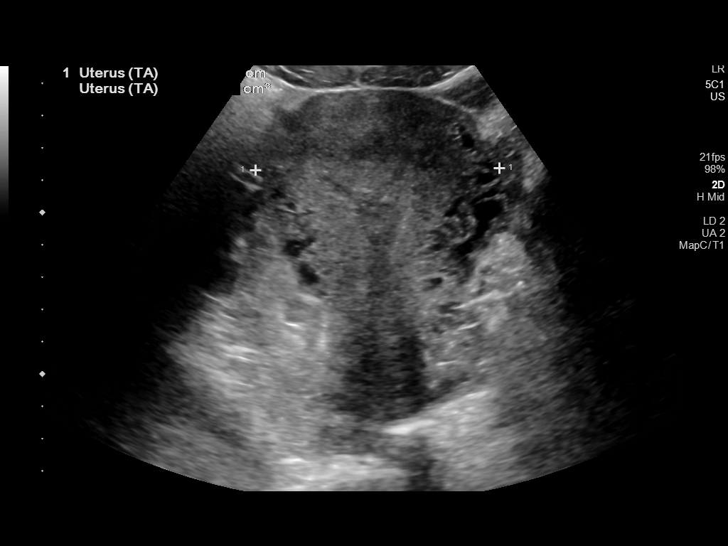
[im 16/87]
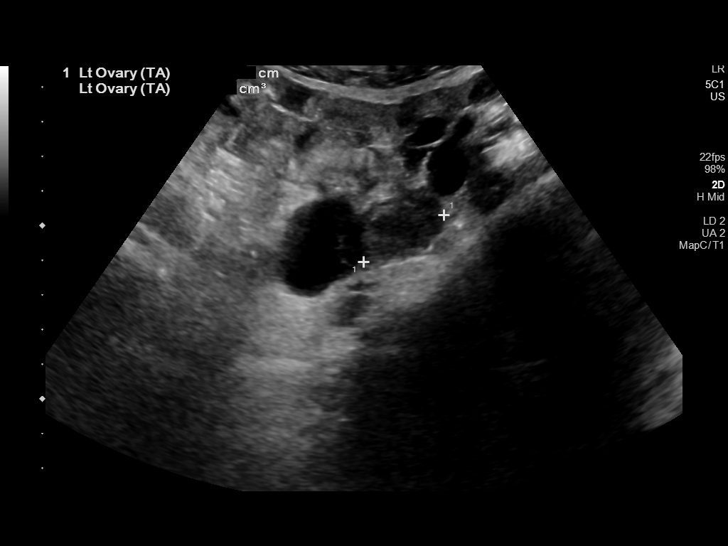
[im 23/87]
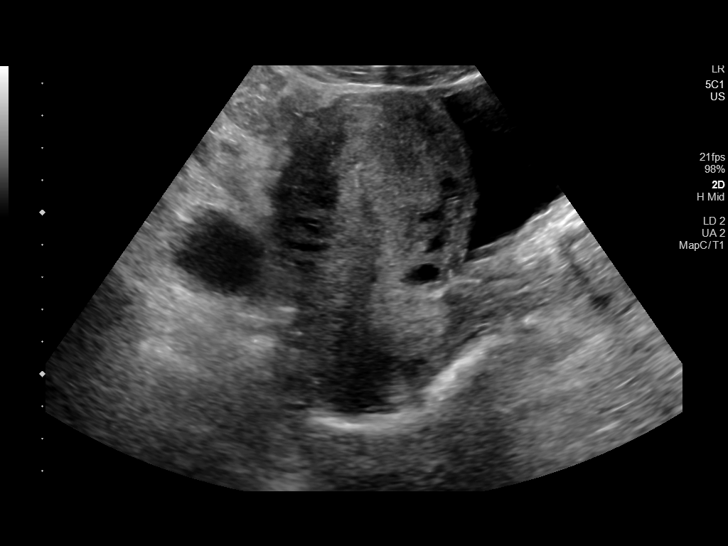
[im 29/87]
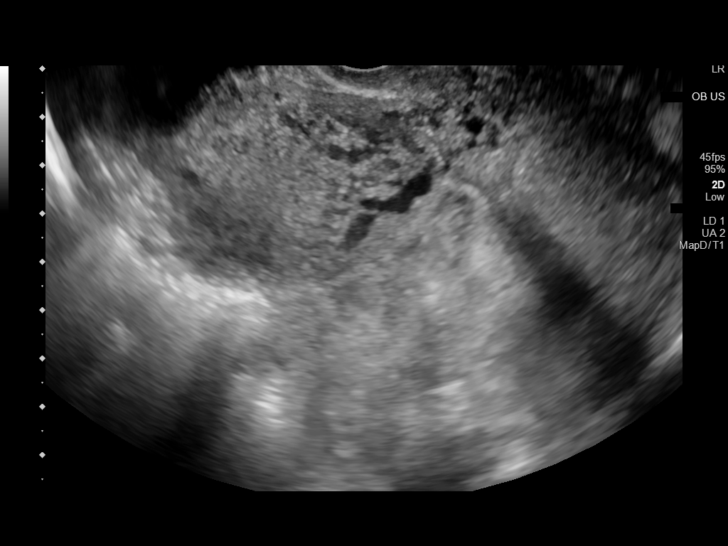
[im 36/87]
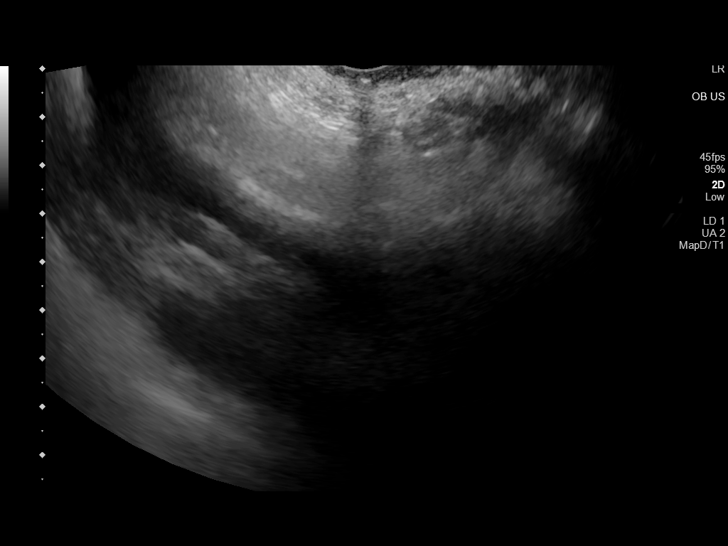
[im 42/87]
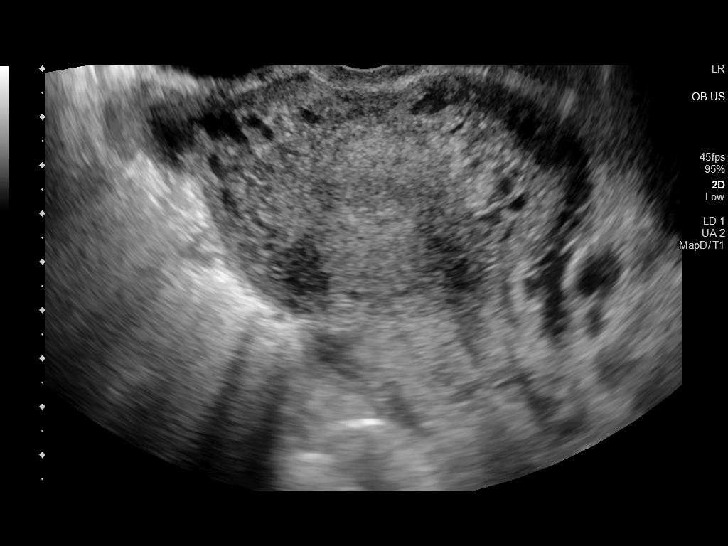
[im 48/87]
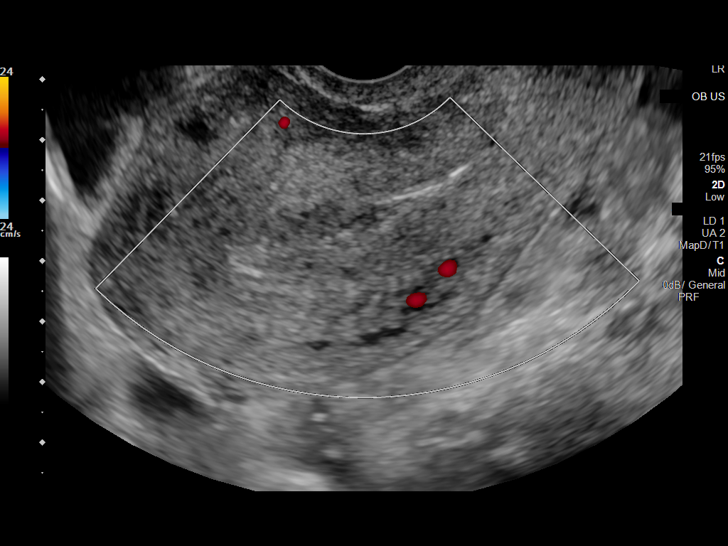
[im 55/87]
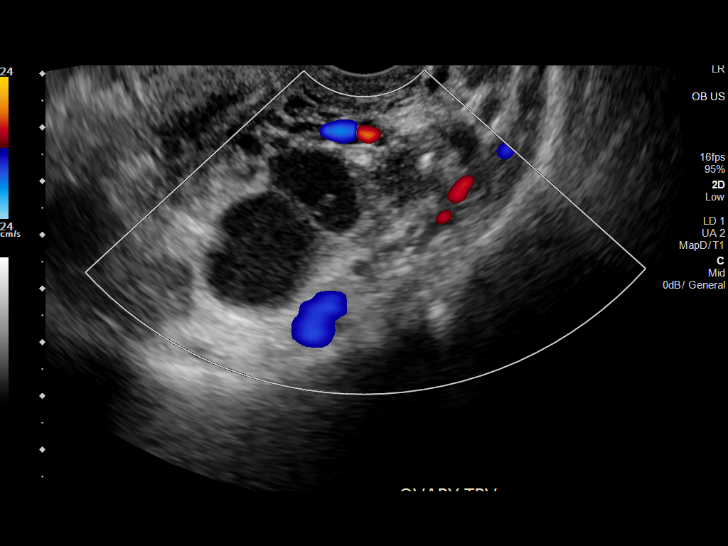
[im 61/87]
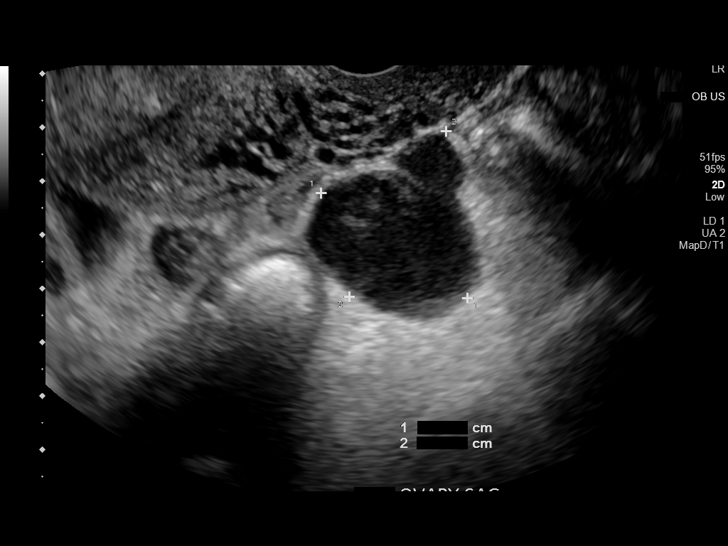
[im 67/87]
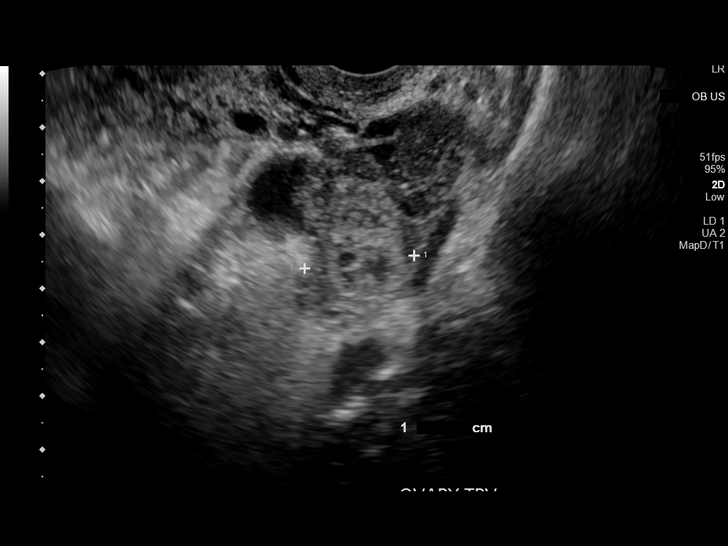
[im 74/87]
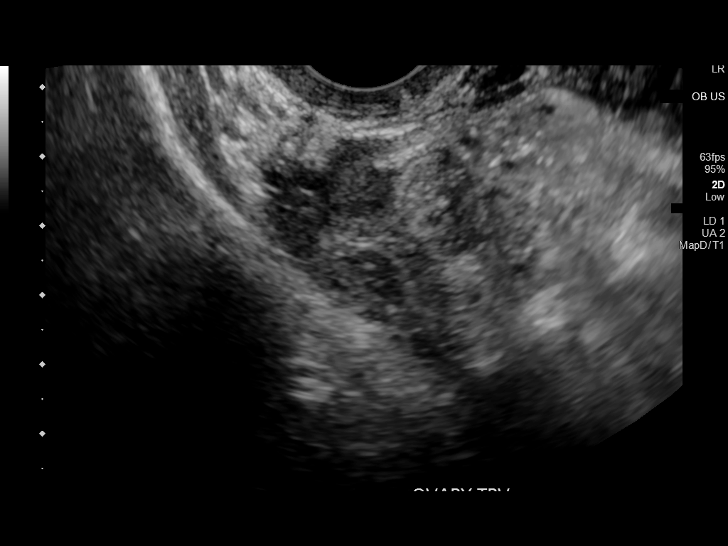
[im 80/87]
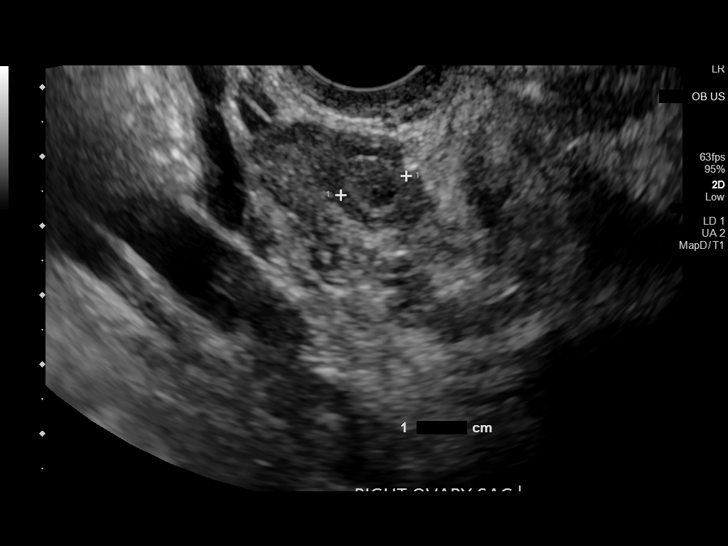
[im 87/87]
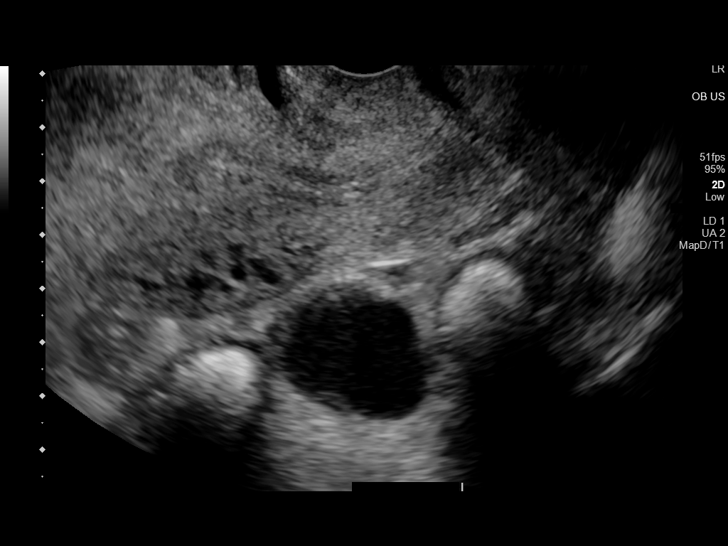

[14 of 28 positions shown; findings below may reference images not displayed]

FINDINGS: Intrauterine gestational sac: None

Yolk sac:  Not visualized

Embryo:  Not visualized

Cardiac Activity: Not visualized

Heart Rate:  bpm

MSD:    mm    w     d

CRL:     mm    w  d                  US EDC:

Subchorionic hemorrhage:  None visualized.

Maternal uterus/adnexae: 2.8 cm complex cystic area in the left
ovary, likely hemorrhagic cyst. Probable collapsing corpus luteal
cyst in the right ovary.
IMPRESSION: Previously seen questionable gestational sac no longer visualized.
No intrauterine gestation visualized.

Complex cystic areas within the ovaries bilaterally, the largest
cm in the left ovary, likely hemorrhagic cyst.
